# Patient Record
Sex: Female | Born: 1995 | Race: White | Hispanic: No | Marital: Married | Smoking: Never smoker
Health system: Southern US, Community
[De-identification: ages and names within clinical notes are randomized; demographics above are authoritative.]

## PROBLEM LIST (undated history)

## (undated) DIAGNOSIS — N809 Endometriosis, unspecified: Secondary | ICD-10-CM

## (undated) DIAGNOSIS — E039 Hypothyroidism, unspecified: Secondary | ICD-10-CM

## (undated) DIAGNOSIS — J45909 Unspecified asthma, uncomplicated: Secondary | ICD-10-CM

## (undated) HISTORY — DX: Endometriosis, unspecified: N80.9

## (undated) HISTORY — PX: WISDOM TOOTH EXTRACTION: SHX21

---

## 2017-05-12 ENCOUNTER — Other Ambulatory Visit (HOSPITAL_COMMUNITY): Payer: Self-pay | Admitting: Obstetrics and Gynecology

## 2017-05-12 DIAGNOSIS — Z3689 Encounter for other specified antenatal screening: Secondary | ICD-10-CM

## 2017-05-20 ENCOUNTER — Encounter (HOSPITAL_COMMUNITY): Payer: Self-pay

## 2017-05-21 ENCOUNTER — Ambulatory Visit (HOSPITAL_COMMUNITY)
Admission: RE | Admit: 2017-05-21 | Discharge: 2017-05-21 | Disposition: A | Discharge status: Home / Self Care | Payer: Medicaid Other | Source: Ambulatory Visit | Attending: Obstetrics and Gynecology | Admitting: Obstetrics and Gynecology

## 2017-05-21 ENCOUNTER — Other Ambulatory Visit (HOSPITAL_COMMUNITY): Payer: Self-pay | Admitting: Obstetrics and Gynecology

## 2017-05-21 ENCOUNTER — Encounter (HOSPITAL_COMMUNITY): Payer: Self-pay

## 2017-05-21 DIAGNOSIS — Z363 Encounter for antenatal screening for malformations: Secondary | ICD-10-CM

## 2017-05-21 DIAGNOSIS — O99282 Endocrine, nutritional and metabolic diseases complicating pregnancy, second trimester: Secondary | ICD-10-CM | POA: Diagnosis not present

## 2017-05-21 DIAGNOSIS — O281 Abnormal biochemical finding on antenatal screening of mother: Secondary | ICD-10-CM

## 2017-05-21 DIAGNOSIS — Z3A20 20 weeks gestation of pregnancy: Secondary | ICD-10-CM

## 2017-05-21 DIAGNOSIS — O280 Abnormal hematological finding on antenatal screening of mother: Secondary | ICD-10-CM

## 2017-05-21 DIAGNOSIS — O321XX0 Maternal care for breech presentation, not applicable or unspecified: Secondary | ICD-10-CM | POA: Diagnosis not present

## 2017-05-21 DIAGNOSIS — Z3689 Encounter for other specified antenatal screening: Secondary | ICD-10-CM

## 2017-05-21 DIAGNOSIS — E039 Hypothyroidism, unspecified: Secondary | ICD-10-CM | POA: Insufficient documentation

## 2017-05-21 HISTORY — DX: Hypothyroidism, unspecified: E03.9

## 2017-05-21 NOTE — Addendum Note (Signed)
Encounter addended by: Genevie CheshireWaken, Godson Pollan M, RT on: 05/21/2017 3:18 PM  Actions taken: Imaging Exam ended

## 2017-05-21 NOTE — Progress Notes (Signed)
Genetic Counseling  High-Risk Gestation Note  Appointment Date:  05/21/2017 Referred By: Margaret Levine, Craig, MD Date of Birth:  [birth year] Partner:  Margaret Levine   Pregnancy History: G1P0 Estimated Date of Delivery: 10/06/17 Estimated Gestational Age: 8963w2d Attending: Ledon SnareBrian Brost, MD   Ms. Margaret Levine, were seen for genetic counseling because of an increased risk for fetal Down syndrome based on Quad screen through [facility].  In summary:  Reviewed results of Quad screening test  Increased risk for Down syndrome (1 in 187)  Trisomy 18 and Open Spina Bifida risks within normal limits  Elevated hCG and DIA- associated with increased risk for adverse pregnancy outcomes, consider third trimester ultrasound to assess fetal growth  Discussed additional screening options  NIPS- elected to pursue Panorama today   Ultrasound- performed today; see separate report  Discussed diagnostic testing options  Amniocentesis- declined  Reviewed family history concerns  Discussed general population carrier screening options- patient reported previously performed  CF  SMA  Hemoglobinopathies  They were counseled regarding the Quad screening result and the associated 1 in 187 risk for fetal Down syndrome.  We reviewed chromosomes, nondisjunction, and the common features and variable prognosis of Down syndrome.  In addition, we reviewed the screen adjusted reduction in risks for trisomy 18 and open neural tube defects.  We also discussed other explanations for a screen positive result including: a gestational dating error, differences in maternal metabolism, and normal variation.  We reviewed other available screening options including noninvasive prenatal screening (NIPS)/cell free DNA (cfDNA) screening and detailed ultrasound. They were counseled that screening tests are used to modify a patient's a priori risk for aneuploidy, typically based on age.  This estimate provides a pregnancy specific risk assessment. We reviewed the benefits and limitations of each option. Specifically, we discussed the conditions for which each test screens, the detection rates, and false positive rates of each. They were also counseled regarding diagnostic testing via amniocentesis. We reviewed the approximate 1 in 300-500 risk for complications from amniocentesis, including spontaneous pregnancy loss. We discussed the possible results that the tests might provide including: positive, negative, unanticipated, and no result. Finally, they were counseled regarding the cost of each option and potential out of pocket expenses. After consideration of all the options, they elected to proceed with NIPS ([facility]).  Those results will be available in 8-10 days.    A complete ultrasound was performed today. The ultrasound report will be sent under separate cover. There were no visualized fetal anomalies or markers suggestive of aneuploidy. Follow-up ultrasound was scheduled in 4 weeks to complete fetal anatomic survey. Diagnostic testing was declined today.  They understand that screening tests cannot rule out all birth defects or genetic syndromes. The patient was advised of this limitation and states she still does not want additional testing at this time.   Margaret Levine was provided with written information regarding cystic fibrosis (CF), spinal muscular atrophy (SMA) and hemoglobinopathies including the carrier frequency, availability of carrier screening and prenatal diagnosis if indicated.  In addition, we discussed that CF and hemoglobinopathies are routinely screened for as part of the [place] newborn screening panel.  She reported that screening was performed through her OB office and were within normal limits. We do not have medical documentation confirming this report at this time.    Both family histories were reviewed and found to be  noncontributory for birth defect, intellectual disability, recurrent pregnancy loss, and known genetic conditions.  Consanguinity was denied for the couple. Without further information regarding the provided family history, an accurate genetic risk cannot be calculated. Further genetic counseling is warranted if more information is obtained.  Ms. Margaret Levine denied exposure to environmental toxins or chemical agents. She denied the use of alcohol, tobacco or street drugs. She denied significant viral illnesses during the course of her pregnancy. Her medical and surgical histories were contributory for thyroid disease, for which she is treated with levothyroxine.   I counseled this couple for approximately 40 minutes regarding the above risks and available options.   Margaret PlowmanKaren Darlene Bartelt, MS,  Certified Genetic Counselor 05/21/2017

## 2017-05-24 ENCOUNTER — Other Ambulatory Visit (HOSPITAL_COMMUNITY): Payer: Self-pay | Admitting: *Deleted

## 2017-05-24 DIAGNOSIS — Z0489 Encounter for examination and observation for other specified reasons: Secondary | ICD-10-CM

## 2017-05-24 DIAGNOSIS — IMO0002 Reserved for concepts with insufficient information to code with codable children: Secondary | ICD-10-CM

## 2017-06-01 ENCOUNTER — Telehealth (HOSPITAL_COMMUNITY): Payer: Self-pay | Admitting: MS"

## 2017-06-01 NOTE — Telephone Encounter (Signed)
Called Margaret Levine Levine to discuss her prenatal cell free DNA test results.  Ms. Margaret Levine Conti had [facility]ama testing through [facility].  Testing was offered because of abnormal maternal serum screening for Down syndrome.   The patient was identified by name and DOB.  We reviewed that these are within normal limits, showing a less than 1 in 10,000 risk for trisomies 21, 18 and 13, and monosomy X (Turner syndrome).  In addition, the risk for triploidy and sex chromosome trisomies (47,XXX and 47,XXY) was also low risk.  We reviewed that this testing identifies > 99% of pregnancies with trisomy 1821, trisomy 9013, sex chromosome trisomies (47,XXX and 47,XXY), and triploidy. The detection rate for trisomy 18 is 96%.  The detection rate for monosomy X is ~92%.  The false positive rate is <0.1% for all conditions. Testing was also consistent with female fetal sex.  The patient did wish to know fetal sex.  She understands that this testing does not identify all genetic conditions.  All questions were answered to her satisfaction, she was encouraged to call with additional questions or concerns.  Quinn PlowmanKaren Terryon Pineiro, MS Certified Genetic Counselor 06/01/2017 1:33 PM

## 2017-06-18 ENCOUNTER — Encounter (HOSPITAL_COMMUNITY): Payer: Self-pay

## 2017-06-18 ENCOUNTER — Other Ambulatory Visit (HOSPITAL_COMMUNITY): Payer: Self-pay | Admitting: *Deleted

## 2017-06-18 ENCOUNTER — Ambulatory Visit (HOSPITAL_COMMUNITY)
Admission: RE | Admit: 2017-06-18 | Discharge: 2017-06-18 | Disposition: A | Discharge status: Home / Self Care | Payer: Medicaid Other | Source: Ambulatory Visit | Attending: Obstetrics and Gynecology | Admitting: Obstetrics and Gynecology

## 2017-06-18 DIAGNOSIS — O283 Abnormal ultrasonic finding on antenatal screening of mother: Secondary | ICD-10-CM | POA: Insufficient documentation

## 2017-06-18 DIAGNOSIS — Z363 Encounter for antenatal screening for malformations: Secondary | ICD-10-CM | POA: Insufficient documentation

## 2017-06-18 DIAGNOSIS — E079 Disorder of thyroid, unspecified: Secondary | ICD-10-CM

## 2017-06-18 DIAGNOSIS — O99280 Endocrine, nutritional and metabolic diseases complicating pregnancy, unspecified trimester: Principal | ICD-10-CM

## 2017-06-18 DIAGNOSIS — Z3A24 24 weeks gestation of pregnancy: Secondary | ICD-10-CM | POA: Diagnosis not present

## 2017-06-18 DIAGNOSIS — O99282 Endocrine, nutritional and metabolic diseases complicating pregnancy, second trimester: Secondary | ICD-10-CM | POA: Diagnosis not present

## 2017-06-18 DIAGNOSIS — E039 Hypothyroidism, unspecified: Secondary | ICD-10-CM | POA: Insufficient documentation

## 2017-06-18 DIAGNOSIS — O280 Abnormal hematological finding on antenatal screening of mother: Secondary | ICD-10-CM

## 2017-06-18 DIAGNOSIS — IMO0002 Reserved for concepts with insufficient information to code with codable children: Secondary | ICD-10-CM

## 2017-06-18 DIAGNOSIS — Z0489 Encounter for examination and observation for other specified reasons: Secondary | ICD-10-CM

## 2017-07-13 HISTORY — PX: LAPAROSCOPY: SHX197

## 2017-07-13 HISTORY — PX: APPENDECTOMY: SHX54

## 2017-07-30 ENCOUNTER — Other Ambulatory Visit (HOSPITAL_COMMUNITY): Payer: Self-pay | Admitting: Obstetrics and Gynecology

## 2017-07-30 ENCOUNTER — Encounter (HOSPITAL_COMMUNITY): Payer: Self-pay

## 2017-07-30 ENCOUNTER — Ambulatory Visit (HOSPITAL_COMMUNITY)
Admission: RE | Admit: 2017-07-30 | Discharge: 2017-07-30 | Disposition: A | Discharge status: Home / Self Care | Payer: Medicaid Other | Source: Ambulatory Visit | Attending: Obstetrics and Gynecology | Admitting: Obstetrics and Gynecology

## 2017-07-30 DIAGNOSIS — O280 Abnormal hematological finding on antenatal screening of mother: Secondary | ICD-10-CM

## 2017-07-30 DIAGNOSIS — O99281 Endocrine, nutritional and metabolic diseases complicating pregnancy, first trimester: Secondary | ICD-10-CM | POA: Insufficient documentation

## 2017-07-30 DIAGNOSIS — O99280 Endocrine, nutritional and metabolic diseases complicating pregnancy, unspecified trimester: Principal | ICD-10-CM

## 2017-07-30 DIAGNOSIS — E039 Hypothyroidism, unspecified: Secondary | ICD-10-CM | POA: Diagnosis not present

## 2017-07-30 DIAGNOSIS — E079 Disorder of thyroid, unspecified: Secondary | ICD-10-CM

## 2017-07-30 DIAGNOSIS — Z3A30 30 weeks gestation of pregnancy: Secondary | ICD-10-CM

## 2017-07-30 DIAGNOSIS — Z362 Encounter for other antenatal screening follow-up: Secondary | ICD-10-CM

## 2017-07-30 DIAGNOSIS — O289 Unspecified abnormal findings on antenatal screening of mother: Secondary | ICD-10-CM

## 2017-07-30 NOTE — ED Notes (Signed)
[facility]  Reports some vaginal spotting after intercourse and BM this morning.  Pt states it was bright red but stopped without needing to wear a sanitary pad.  Reports some uterine irritability approx. X 3 today when bleeding occurred.  Good fetal movement.

## 2017-07-30 NOTE — Addendum Note (Signed)
Encounter addended by: Melynda KellerVics, Jilliane Kazanjian R, RDMS on: 07/30/2017 4:04 PM  Actions taken: Imaging Exam ended

## 2017-08-03 ENCOUNTER — Other Ambulatory Visit (HOSPITAL_COMMUNITY): Payer: Self-pay | Admitting: *Deleted

## 2017-08-03 DIAGNOSIS — O280 Abnormal hematological finding on antenatal screening of mother: Secondary | ICD-10-CM

## 2017-08-27 ENCOUNTER — Ambulatory Visit (HOSPITAL_COMMUNITY)
Admission: RE | Admit: 2017-08-27 | Discharge: 2017-08-27 | Disposition: A | Discharge status: Home / Self Care | Payer: Medicaid Other | Source: Ambulatory Visit | Attending: Obstetrics and Gynecology | Admitting: Obstetrics and Gynecology

## 2017-08-27 ENCOUNTER — Encounter (HOSPITAL_COMMUNITY): Payer: Self-pay

## 2017-10-06 ENCOUNTER — Emergency Department (HOSPITAL_COMMUNITY)
Admission: EM | Admit: 2017-10-06 | Discharge: 2017-10-07 | Disposition: A | Discharge status: Home / Self Care | Payer: Commercial Managed Care - PPO | Attending: Emergency Medicine | Admitting: Emergency Medicine

## 2017-10-06 ENCOUNTER — Encounter (HOSPITAL_COMMUNITY): Payer: Self-pay | Admitting: Emergency Medicine

## 2017-10-06 ENCOUNTER — Other Ambulatory Visit: Payer: Self-pay

## 2017-10-06 ENCOUNTER — Emergency Department (HOSPITAL_COMMUNITY): Payer: Commercial Managed Care - PPO

## 2017-10-06 DIAGNOSIS — K668 Other specified disorders of peritoneum: Secondary | ICD-10-CM | POA: Diagnosis not present

## 2017-10-06 DIAGNOSIS — Z79899 Other long term (current) drug therapy: Secondary | ICD-10-CM | POA: Diagnosis not present

## 2017-10-06 DIAGNOSIS — R079 Chest pain, unspecified: Secondary | ICD-10-CM | POA: Diagnosis not present

## 2017-10-06 DIAGNOSIS — E039 Hypothyroidism, unspecified: Secondary | ICD-10-CM | POA: Insufficient documentation

## 2017-10-06 HISTORY — DX: Unspecified asthma, uncomplicated: J45.909

## 2017-10-06 LAB — BASIC METABOLIC PANEL
ANION GAP: 10 (ref 5–15)
BUN: 7 mg/dL (ref 6–20)
CALCIUM: 8 mg/dL — AB (ref 8.9–10.3)
CO2: 20 mmol/L — ABNORMAL LOW (ref 22–32)
Chloride: 106 mmol/L (ref 101–111)
Creatinine, Ser: 0.57 mg/dL (ref 0.44–1.00)
Glucose, Bld: 90 mg/dL (ref 65–99)
Potassium: 3.7 mmol/L (ref 3.5–5.1)
SODIUM: 136 mmol/L (ref 135–145)

## 2017-10-06 LAB — CBC
HCT: 33.9 % — ABNORMAL LOW (ref 36.0–46.0)
HEMOGLOBIN: 11 g/dL — AB (ref 12.0–15.0)
MCH: 31.2 pg (ref 26.0–34.0)
MCHC: 32.4 g/dL (ref 30.0–36.0)
MCV: 96 fL (ref 78.0–100.0)
PLATELETS: DECREASED 10*3/uL (ref 150–400)
RBC: 3.53 MIL/uL — AB (ref 3.87–5.11)
RDW: 14.3 % (ref 11.5–15.5)
WBC: 13.5 10*3/uL — AB (ref 4.0–10.5)

## 2017-10-06 LAB — I-STAT TROPONIN, ED: TROPONIN I, POC: 0 ng/mL (ref 0.00–0.08)

## 2017-10-06 LAB — I-STAT BETA HCG BLOOD, ED (MC, WL, AP ONLY)

## 2017-10-06 MED ORDER — IOPAMIDOL (ISOVUE-300) INJECTION 61%
INTRAVENOUS | Status: AC
  Filled 2017-10-06: qty 30

## 2017-10-06 NOTE — ED Triage Notes (Signed)
Pt arrives from home via [facility] reporting allergic rxn to unknown substance with substernal CP radiating to L chest, describes as "squeezing."  EMS reports giving 50 mL IV benadryl and 324 ASA.  On arrival pt reports "sandpaper feeling" in throat, denies itching or tingling to lips or tongue.  Rash noted to upper chest wal, some redness to bilat cheeks. Resp e/u, pt denies CP at this time.

## 2017-10-06 NOTE — ED Provider Notes (Signed)
[facility] Provider Note   CSN: [id number] Arrival date & time: 10/06/17  2020     History   Chief Complaint Chief Complaint  Patient presents with  . Chest Pain  . Allergic Reaction    HPI Margaret LeakKendra Levine is a 22 y.o. female.  The history is provided by the patient and medical records.   22 y.o. F with hx of hypothyroidism, presenting to the ED for chest pain and SOB.  Patient states she was discharged from the hospital earlier today [place]  No immediate complications.  States she went home today but did not have a BM yet.  States she felt the urge today, went to have a BM and afterwards started to feel bad.  States a few hours later she started to develop some chest pain and trouble breathing.  States pain sharp in lower chest.  She denies any cough or fever.  States pain persisted.  When she arrived here she was told that she had hives on her chest but she did not notice that at home.  She denies any generalized itching, sensation of throat closing, trouble swallowing, etc.  States only change today was she finally took a shower.  She did notice some splotchy spots on her abdomen.  No new soaps or detergents at home.  They did send her home with oxycodone, Tylenol, and Motrin from the hospital but she has taken all of these in the past without issue.  Her only known allergy is cockroaches.  She is currently breastfeeding.  Patient denies any pain by time of my evaluation (approx 2 hours after arrival in ED).  Past Medical History:  Diagnosis Date  . Hypothyroidism     Patient Active Problem List   Diagnosis Date Noted  . Abnormal maternal serum alpha-fetoprotein 05/21/2017  . [redacted] weeks gestation of pregnancy     Past Surgical History:  Procedure Laterality Date  . CESAREAN SECTION    . WISDOM TOOTH EXTRACTION       OB History    Gravida  1   Para  1   Term  1   Preterm      AB      Living  1      SAB      TAB      Ectopic      Multiple      Live Births  1            Home Medications    Prior to Admission medications   Medication Sig Start Date End Date Taking? Authorizing Provider  flintstones complete (FLINTSTONES) 60 MG chewable tablet Chew 1 tablet by mouth daily.    [provider]  Ondansetron HCl (ZOFRAN PO) Take by mouth.    [provider]  Thyroid (LEVOTHYROXINE-LIOTHYRONINE PO) Take 25 mcg daily by mouth.    [provider]    Family History No family history on file.  Social History Social History   Tobacco Use  . Smoking status: Never Smoker  . Smokeless tobacco: Never Used  Substance Use Topics  . Alcohol use: No    Frequency: Never  . Drug use: No     Allergies   Patient has no known allergies.   Review of Systems Review of Systems  Respiratory: Positive for shortness of breath.   Cardiovascular: Positive for chest pain.  All other systems reviewed and are negative.    Physical Exam Updated  Vital Signs BP 128/89 (BP Location: Right Arm)   Pulse 84   Temp 99.1 F (37.3 C) (Oral)   Resp 16   LMP 12/30/2016   SpO2 100%   Breastfeeding? Yes   Physical Exam  Constitutional: She is oriented to person, place, and time. She appears well-developed and well-nourished.  HENT:  Head: Normocephalic and atraumatic.  Mouth/Throat: Oropharynx is clear and moist.  No oral swelling, airway patent, handling secretions well, normal phonation without stridor  Eyes: Pupils are equal, round, and reactive to light. Conjunctivae and EOM are normal.  Neck: Normal range of motion.  Cardiovascular: Normal rate, regular rhythm and normal heart sounds.  Pulmonary/Chest: Effort normal and breath sounds normal. She has no wheezes. She has no rhonchi.  Abdominal: Soft. Bowel sounds are normal. There is no tenderness. There is no rigidity and no guarding.  c-section scar across lower abdomen appears clean, no signs of  infection; there is some splotchy areas on the abdomen but no defined rash, areas are non-pruritic  Musculoskeletal: Normal range of motion.  Neurological: She is alert and oriented to person, place, and time.  Skin: Skin is warm and dry. No rash noted.  No discrete rash  Psychiatric: She has a normal mood and affect.  Nursing note and vitals reviewed.    ED Treatments / Results  Labs (all labs ordered are listed, but only abnormal results are displayed) Labs Reviewed  BASIC METABOLIC PANEL - Abnormal; Notable for the following components:      Result Value   CO2 20 (*)    Calcium 8.0 (*)    All other components within normal limits  CBC - Abnormal; Notable for the following components:   WBC 13.5 (*)    RBC 3.53 (*)    Hemoglobin 11.0 (*)    HCT 33.9 (*)    All other components within normal limits  I-STAT BETA HCG BLOOD, ED ([facility], [facility], [facility] ONLY) - Abnormal; Notable for the following components:   I-stat hCG, quantitative >2,000.0 (*)    All other components within normal limits  I-STAT TROPONIN, ED    EKG ED ECG REPORT   Date: 10/07/2017  Rate: 80  Rhythm: normal sinus rhythm  QRS Axis: normal  Intervals: normal  ST/T Wave abnormalities: normal  Conduction Disutrbances:none  Narrative Interpretation:   Old EKG Reviewed: none available  I have personally reviewed the EKG tracing and agree with the computerized printout as noted.   Radiology Dg Chest 2 View  Addendum Date: 10/06/2017   ADDENDUM REPORT: 10/06/2017 21:38 ADDENDUM: Study discussed by telephone with Dr. Shaune Pollack on 10/06/2017 at 2119 hours. He advises that this patient might be recently status C-section (not a certainty when we spoke). We discussed that a recent surgery might satisfactorily explain the small volume of pneumoperitoneum. He will evaluate the patient further. Electronically Signed   By: Odessa Fleming M.D.   On: 10/06/2017 21:38   Result Date: 10/06/2017 CLINICAL DATA:  22 year old female with  substernal chest pain. EXAM: CHEST - 2 VIEW COMPARISON:  Chest radiographs 02/16/2013. FINDINGS: Positive for pneumoperitoneum. There is a small volume of free fluid subjacent to the diaphragm on both views. The visible bowel gas pattern is unremarkable. No pneumothorax or pneumomediastinum is evident. Visualized tracheal air column is within normal limits. No pulmonary edema, pleural effusion or abnormal pulmonary opacity. No osseous abnormality identified. IMPRESSION: 1. Positive for free air under the diaphragm suggesting an acute bowel perforation. Recommend CT Abdomen and Pelvis (oral and  IV contrast preferred if the patient is stable). 2.  No acute cardiopulmonary abnormality. Electronically Signed: By: Odessa Fleming M.D. On: 10/06/2017 21:15   Ct Abdomen Pelvis W Contrast  Result Date: 10/07/2017 CLINICAL DATA:  22 year old female with history of recent C-section. Concern for peritonitis EXAM: CT ABDOMEN AND PELVIS WITH CONTRAST TECHNIQUE: Multidetector CT imaging of the abdomen and pelvis was performed using the standard protocol following bolus administration of intravenous contrast. CONTRAST:  <See Chart> ISOVUE-300 IOPAMIDOL (ISOVUE-300) INJECTION 61% COMPARISON:  Chest radiograph dated 10/06/2017 and CT of the abdomen pelvis dated 09/07/2011 FINDINGS: Lower chest: Trace bilateral pleural effusions with associated minimal compressive atelectasis of the lung bases. Small scattered pneumoperitoneum, consistent with history of recent C-section. There is small amount of free fluid within the pelvis and right upper quadrant. Hepatobiliary: Focal area of decreased enhancement along the inferior age of the right lobe of liver (series 3, image 48) may reflect focal edema or related to a traumatic injury. There is moderate periportal edema. There is diffuse thickened and edematous appearance of the gallbladder wall which may be related to underlying systemic or metabolic etiology. Acute cholecystitis is not entirely  excluded. Clinical correlation is recommended. Tiny sludge or stones may be present within the gallbladder. Ultrasound may provide better evaluation of the gallbladder. Pancreas: Unremarkable. No pancreatic ductal dilatation or surrounding inflammatory changes. Spleen: Normal in size without focal abnormality. Adrenals/Urinary Tract: The adrenal glands are unremarkable. There is mild right hydroureter, likely related to mass effect and compression of the distal right ureter by the enlarged uterus. The left kidney is unremarkable. The urinary bladder distended and grossly unremarkable. Stomach/Bowel: Oral contrast opacifies loops of small bowel and traverses into the colon. There is no bowel obstruction or active inflammation. The appendix is normal. Vascular/Lymphatic: The abdominal aorta and IVC appear unremarkable. No portal venous gas. There is no adenopathy. Reproductive: The uterus is enlarged and heterogeneous consistent with history of recent delivery. There is postsurgical changes of C-section with multiple tiny specks of air within the endometrial canal, likely postsurgical. The ovaries are grossly unremarkable. Other: Anterior pelvic wall C-section with stranding of the subcutaneous fat and pockets of soft pannus air. No drainable fluid collection or abscess. Musculoskeletal: No acute or significant osseous findings. IMPRESSION: 1. Postsurgical changes of recent C-section with small amount of pneumoperitoneum. No drainable fluid collection or abscess. 2. Focal area of decreased enhancement in the inferior edge of the right lobe of the liver may represent focal edema or related to focal traumatic injury. Clinical correlation is recommended. 3. Moderate periportal edema and diffuse thickening of the gallbladder wall. Findings may be related to an underlying metabolic or systemic etiology or hepatic injury. Acute cholecystitis is not entirely excluded. Clinical correlation is recommended. Ultrasound may  provide better evaluation of the gallbladder if clinically indicated. 4. Mild right hydronephrosis secondary to compression of the distal right ureter by the enlarged uterus. Electronically Signed   By: Elgie Collard M.D.   On: 10/07/2017 01:45    Procedures Procedures (including critical care time)  Medications Ordered in ED Medications  iopamidol (ISOVUE-300) 61 % injection (100 mLs  Contrast Given 10/07/17 0110)     Initial Impression / Assessment and Plan / ED Course  I have reviewed the triage vital signs and the nursing notes.  Pertinent labs & imaging results that were available during my care of the patient were reviewed by me and considered in my medical decision making (see chart for details).  22 year old female presenting  to the ED with chest pain and shortness of breath PTA.  She had recent C-section 3 days ago at [facility].  She is awake, alert, appropriately oriented.  Vital signs are stable.  He had screening labs and chest x-ray performed out front which were concerning for pneumoperitoneum.  There was also noted rash out front, however patient denies any generalized itching, trouble swallowing, sensation of throat closing, etc.  On my assessment patient's surgical incision appears clean without any signs of infection.  Abdomen is not significantly tender, does endorse some "soreness".  She denies any active chest pain or shortness of breath at present.  She has no tachycardia or hypoxia to suggest acute PE.  She has no discrete rash, does have some splotchiness on her abdomen that blanches.  This is not pruritic.  Given findings on CXR, will obtain CT scan to ensure no underlying abnormalities in the abdomen.  Patient is comfortable and resting at this time.  Patient's CT scan with some pneumoperitoneum, can be normal given recent C-section.  She does have some edema along the inferior margin of the right lobe of the liver-- ? Reactive vs traumatic injury.  Also has  noted thickening of gallbladder wall.  Will discuss with general surgery.  2:03 AM Discussed results with Dr. Magnus Ivan-- appreciate his input.  He has reviewed CT and we have gone over this together.  Does not suspect any acute traumatic injury to the liver, amount of pneumoperitoneum is expected so close post-op.  In regards to gallbladder wall thickening, does not feel this is concerning for acute cholecystitis.  Can get [place] if needed.  Patient clinically without focal RUQ pain on repeat exam, no nausea/vomiting here.  She only reports soreness along incision which is to be expected.  Low clinical suspicion for acute cholecystitis at this time.  Continues to deny any active chest pain or SOB here.  No signs/symptoms currently suggestive of PE, cardiomyopathy, endocarditis, or ACS.  Have reviewed all results and recommendations with patient and family.  She will be discharged home to continue symptomatic care.  Will have her follow-up closely with her OB.  She understands to return here for any new/acute changes.  Final Clinical Impressions(s) / ED Diagnoses   Final diagnoses:  Chest pain, unspecified type    ED Discharge Orders    None       Garlon Hatchet, PA-C 10/07/17 [id number]    Mancel Bale, MD 10/09/17 1728

## 2017-10-07 ENCOUNTER — Emergency Department (HOSPITAL_COMMUNITY): Payer: Commercial Managed Care - PPO

## 2017-10-07 ENCOUNTER — Encounter (HOSPITAL_COMMUNITY): Payer: Self-pay | Admitting: Radiology

## 2017-10-07 DIAGNOSIS — R079 Chest pain, unspecified: Secondary | ICD-10-CM | POA: Diagnosis not present

## 2017-10-07 MED ORDER — IOPAMIDOL (ISOVUE-300) INJECTION 61%
INTRAVENOUS | Status: AC
  Administered 2017-10-07: 100 mL
  Filled 2017-10-07: qty 100

## 2017-10-07 NOTE — Discharge Instructions (Signed)
Continue your home medications from your doctor. If you develop rash you can try benadryl.  If this makes you too drowsy, you can try Claritin or zyrtec. Follow-up with your OB-GYN as scheduled. Return here for any new/acute changes-- worsening pain, high fever, vomiting, trouble taking medicines, etc.

## 2017-10-07 NOTE — ED Notes (Signed)
Pt SO exited from room stating pt is "breaking out again" Pt noted to have red splotches on her face, neck, and chest. Pt denies itching, resting comfortably in bed. PA is aware and states she will come assess shortly.

## 2018-07-26 IMAGING — DX DG CHEST 2V
2 series · 2 of 2 positions shown · non-contrast
Comparison: Chest radiographs 02/16/2013.

ADDENDUM:
Study discussed by telephone with Dr. QOMANDAN TIGER on 10/06/2017 at
3335 hours.
He advises that this patient might be recently status C-section (not
a certainty when we spoke). We discussed that a recent surgery might
satisfactorily explain the small volume of pneumoperitoneum. He will
evaluate the patient further.
CLINICAL DATA: 21-year-old female with substernal chest pain.

EXAM:
CHEST - 2 VIEW

[chest pa]
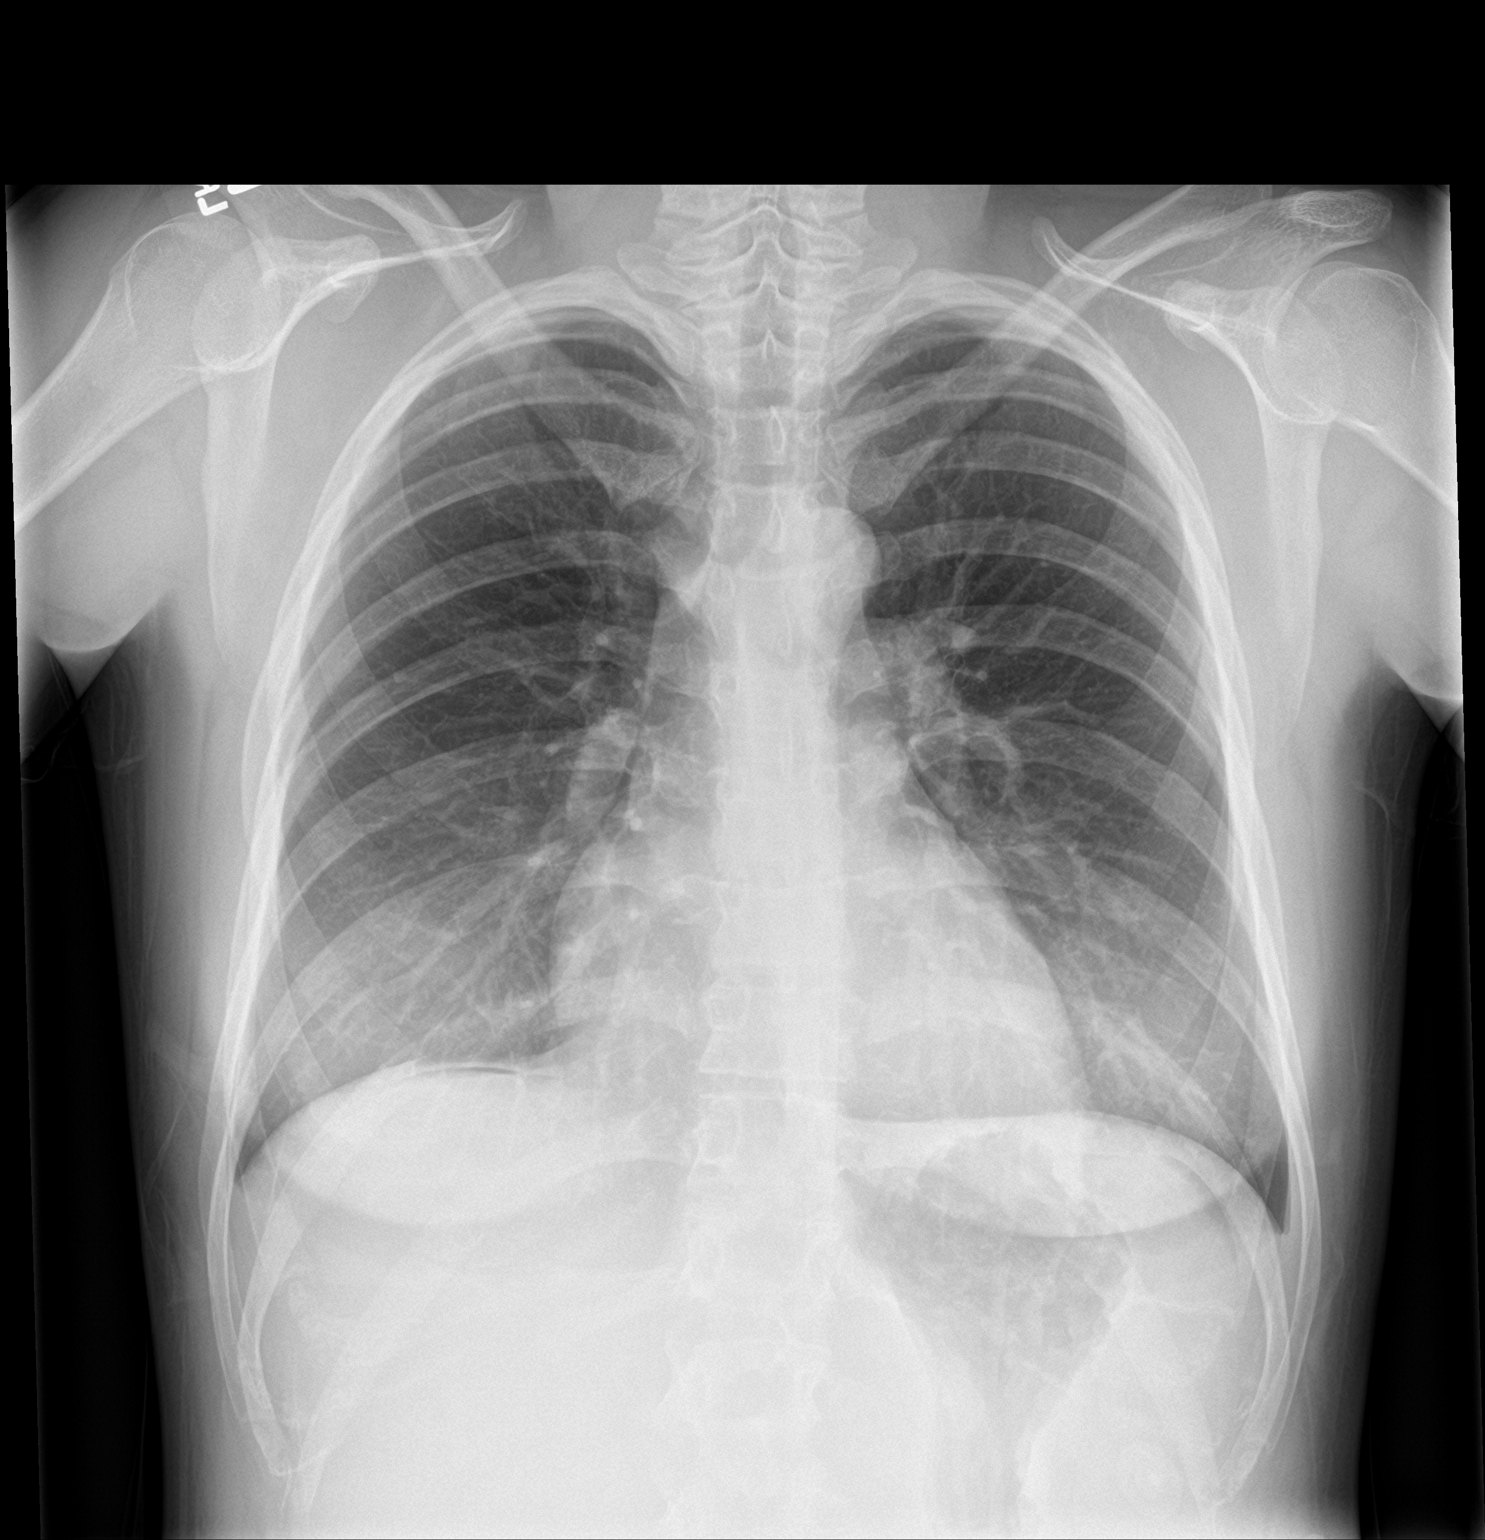

[chest lat]
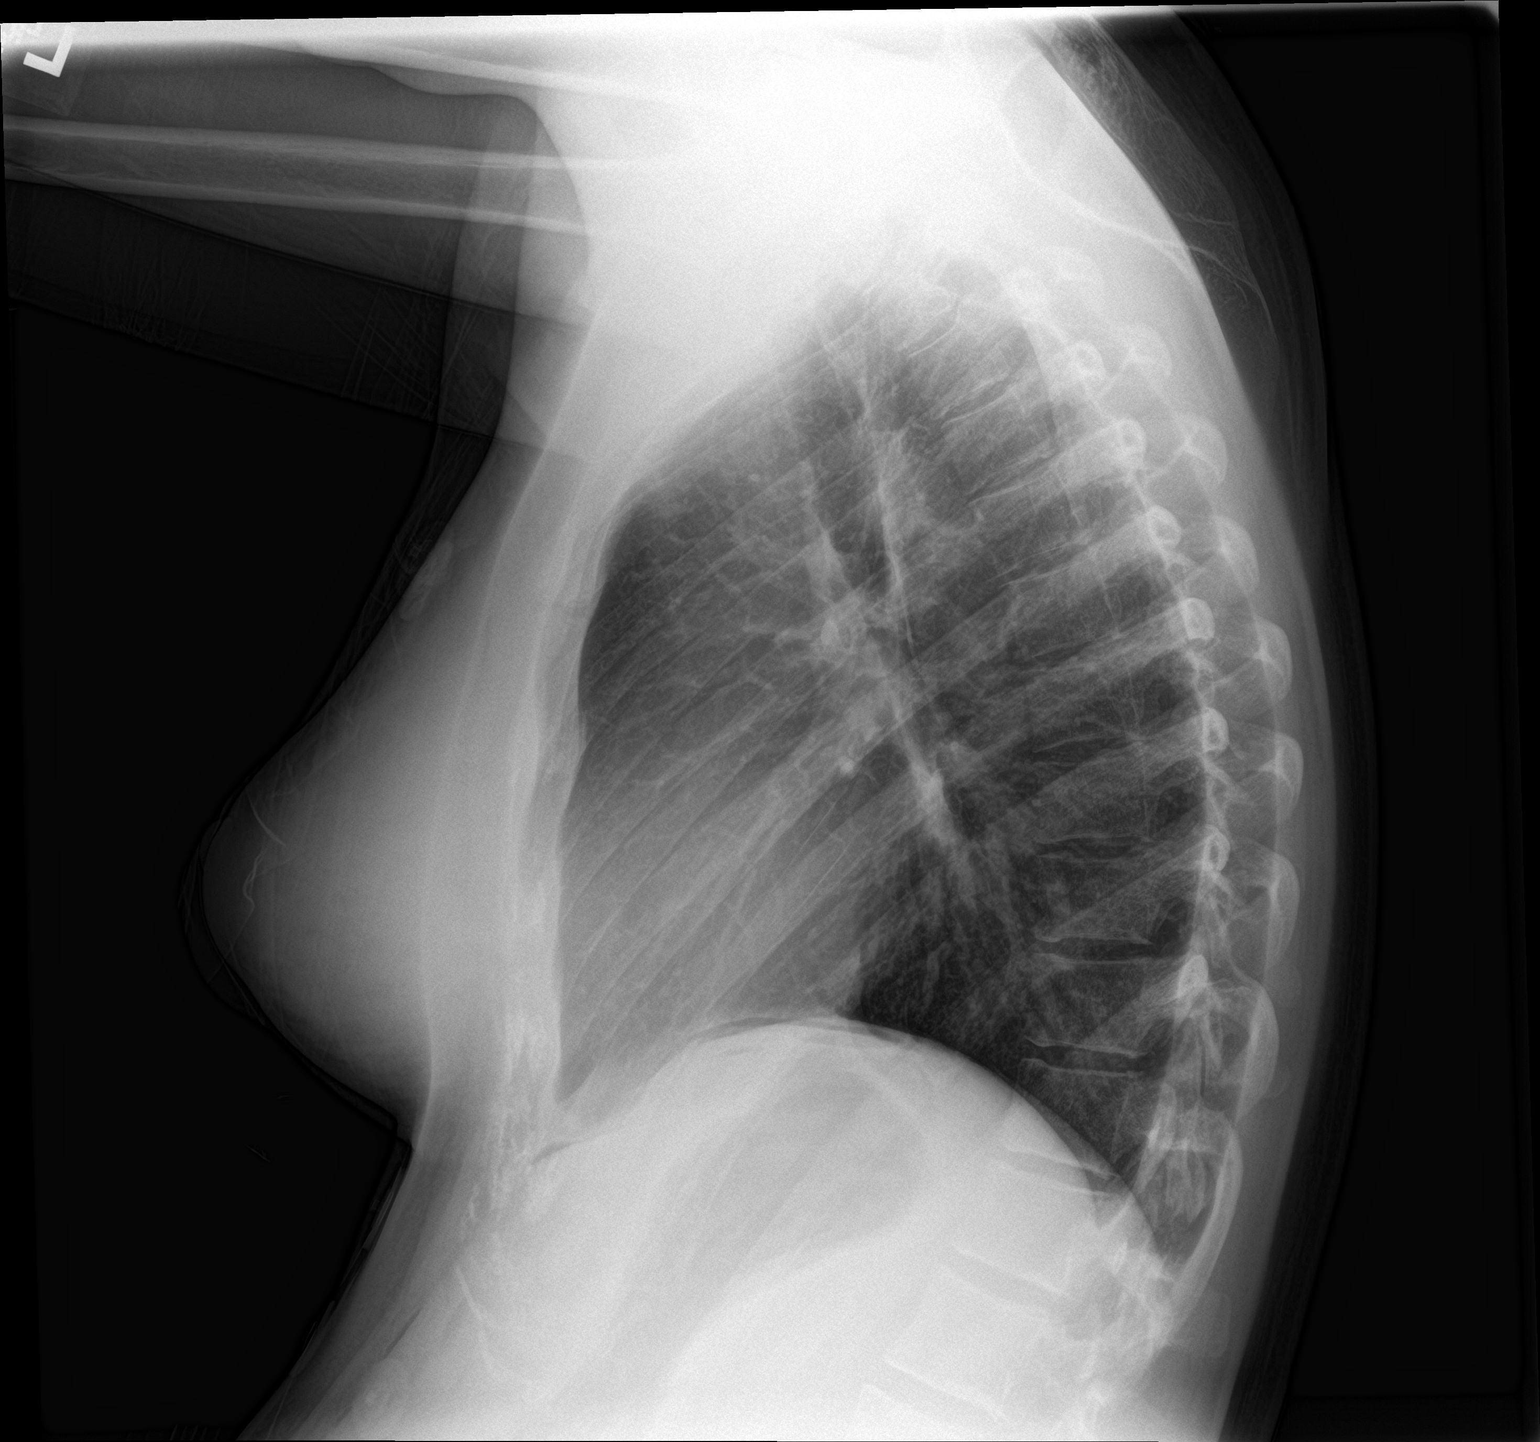

[2 of 2 positions shown; findings below may reference images not displayed]

FINDINGS: Positive for pneumoperitoneum. There is a small volume of free fluid
subjacent to the diaphragm on both views. The visible bowel gas
pattern is unremarkable.

No pneumothorax or pneumomediastinum is evident. Visualized tracheal
air column is within normal limits. No pulmonary edema, pleural
effusion or abnormal pulmonary opacity.

No osseous abnormality identified.
IMPRESSION: 1. Positive for free air under the diaphragm suggesting an acute
bowel perforation. Recommend CT Abdomen and Pelvis (oral and IV
contrast preferred if the patient is stable).
2.  No acute cardiopulmonary abnormality.

## 2019-01-03 DIAGNOSIS — K219 Gastro-esophageal reflux disease without esophagitis: Secondary | ICD-10-CM | POA: Diagnosis not present

## 2019-01-03 DIAGNOSIS — R1031 Right lower quadrant pain: Secondary | ICD-10-CM | POA: Diagnosis not present

## 2019-01-03 DIAGNOSIS — K529 Noninfective gastroenteritis and colitis, unspecified: Secondary | ICD-10-CM | POA: Diagnosis not present

## 2019-01-03 DIAGNOSIS — J45909 Unspecified asthma, uncomplicated: Secondary | ICD-10-CM | POA: Diagnosis not present

## 2019-01-04 DIAGNOSIS — K219 Gastro-esophageal reflux disease without esophagitis: Secondary | ICD-10-CM | POA: Diagnosis not present

## 2019-01-04 DIAGNOSIS — R1031 Right lower quadrant pain: Secondary | ICD-10-CM | POA: Diagnosis not present

## 2019-01-04 DIAGNOSIS — J45909 Unspecified asthma, uncomplicated: Secondary | ICD-10-CM | POA: Diagnosis not present

## 2019-01-04 DIAGNOSIS — K529 Noninfective gastroenteritis and colitis, unspecified: Secondary | ICD-10-CM | POA: Diagnosis not present

## 2021-08-29 DIAGNOSIS — L219 Seborrheic dermatitis, unspecified: Secondary | ICD-10-CM | POA: Diagnosis not present

## 2021-09-11 DIAGNOSIS — R1084 Generalized abdominal pain: Secondary | ICD-10-CM | POA: Diagnosis not present

## 2021-09-11 DIAGNOSIS — R110 Nausea: Secondary | ICD-10-CM | POA: Diagnosis not present

## 2021-09-11 DIAGNOSIS — R1013 Epigastric pain: Secondary | ICD-10-CM | POA: Diagnosis not present

## 2021-09-26 DIAGNOSIS — E039 Hypothyroidism, unspecified: Secondary | ICD-10-CM | POA: Diagnosis not present

## 2021-09-26 DIAGNOSIS — E049 Nontoxic goiter, unspecified: Secondary | ICD-10-CM | POA: Diagnosis not present

## 2021-09-30 DIAGNOSIS — E049 Nontoxic goiter, unspecified: Secondary | ICD-10-CM | POA: Diagnosis not present

## 2021-10-08 DIAGNOSIS — E040 Nontoxic diffuse goiter: Secondary | ICD-10-CM | POA: Diagnosis not present

## 2021-11-05 DIAGNOSIS — S9032XS Contusion of left foot, sequela: Secondary | ICD-10-CM | POA: Diagnosis not present

## 2021-11-05 DIAGNOSIS — G8929 Other chronic pain: Secondary | ICD-10-CM | POA: Diagnosis not present

## 2021-11-05 DIAGNOSIS — M79672 Pain in left foot: Secondary | ICD-10-CM | POA: Diagnosis not present

## 2021-11-27 DIAGNOSIS — R202 Paresthesia of skin: Secondary | ICD-10-CM | POA: Diagnosis not present

## 2021-12-16 DIAGNOSIS — B070 Plantar wart: Secondary | ICD-10-CM | POA: Diagnosis not present

## 2022-01-07 DIAGNOSIS — K5900 Constipation, unspecified: Secondary | ICD-10-CM | POA: Diagnosis not present

## 2022-01-16 DIAGNOSIS — R1032 Left lower quadrant pain: Secondary | ICD-10-CM | POA: Diagnosis not present

## 2022-01-16 DIAGNOSIS — K5909 Other constipation: Secondary | ICD-10-CM | POA: Diagnosis not present

## 2022-02-13 DIAGNOSIS — M7918 Myalgia, other site: Secondary | ICD-10-CM | POA: Diagnosis not present

## 2022-02-13 DIAGNOSIS — N809 Endometriosis, unspecified: Secondary | ICD-10-CM | POA: Diagnosis not present

## 2022-02-13 DIAGNOSIS — N94819 Vulvodynia, unspecified: Secondary | ICD-10-CM | POA: Diagnosis not present

## 2022-02-13 DIAGNOSIS — N941 Unspecified dyspareunia: Secondary | ICD-10-CM | POA: Diagnosis not present

## 2022-03-06 DIAGNOSIS — N809 Endometriosis, unspecified: Secondary | ICD-10-CM | POA: Diagnosis not present

## 2022-03-30 DIAGNOSIS — M7918 Myalgia, other site: Secondary | ICD-10-CM | POA: Diagnosis not present

## 2022-03-30 DIAGNOSIS — N941 Unspecified dyspareunia: Secondary | ICD-10-CM | POA: Diagnosis not present

## 2022-03-30 DIAGNOSIS — N94819 Vulvodynia, unspecified: Secondary | ICD-10-CM | POA: Diagnosis not present

## 2022-03-30 DIAGNOSIS — N809 Endometriosis, unspecified: Secondary | ICD-10-CM | POA: Diagnosis not present

## 2022-04-10 DIAGNOSIS — E049 Nontoxic goiter, unspecified: Secondary | ICD-10-CM | POA: Diagnosis not present

## 2022-04-10 DIAGNOSIS — M064 Inflammatory polyarthropathy: Secondary | ICD-10-CM | POA: Diagnosis not present

## 2022-04-10 DIAGNOSIS — J45909 Unspecified asthma, uncomplicated: Secondary | ICD-10-CM | POA: Diagnosis not present

## 2022-05-05 DIAGNOSIS — R102 Pelvic and perineal pain: Secondary | ICD-10-CM | POA: Diagnosis not present

## 2022-05-05 DIAGNOSIS — N939 Abnormal uterine and vaginal bleeding, unspecified: Secondary | ICD-10-CM | POA: Diagnosis not present

## 2022-06-26 DIAGNOSIS — E039 Hypothyroidism, unspecified: Secondary | ICD-10-CM | POA: Diagnosis not present

## 2022-06-26 DIAGNOSIS — E040 Nontoxic diffuse goiter: Secondary | ICD-10-CM | POA: Diagnosis not present

## 2022-07-10 DIAGNOSIS — N809 Endometriosis, unspecified: Secondary | ICD-10-CM | POA: Diagnosis not present

## 2022-07-10 DIAGNOSIS — M7918 Myalgia, other site: Secondary | ICD-10-CM | POA: Diagnosis not present

## 2022-07-16 DIAGNOSIS — L814 Other melanin hyperpigmentation: Secondary | ICD-10-CM | POA: Diagnosis not present

## 2022-07-16 DIAGNOSIS — D485 Neoplasm of uncertain behavior of skin: Secondary | ICD-10-CM | POA: Diagnosis not present

## 2022-07-16 DIAGNOSIS — D225 Melanocytic nevi of trunk: Secondary | ICD-10-CM | POA: Diagnosis not present

## 2022-07-16 DIAGNOSIS — L578 Other skin changes due to chronic exposure to nonionizing radiation: Secondary | ICD-10-CM | POA: Diagnosis not present

## 2022-07-17 DIAGNOSIS — J019 Acute sinusitis, unspecified: Secondary | ICD-10-CM | POA: Diagnosis not present

## 2022-08-04 DIAGNOSIS — O26891 Other specified pregnancy related conditions, first trimester: Secondary | ICD-10-CM | POA: Diagnosis not present

## 2022-08-04 DIAGNOSIS — Z3490 Encounter for supervision of normal pregnancy, unspecified, unspecified trimester: Secondary | ICD-10-CM | POA: Diagnosis not present

## 2022-08-04 DIAGNOSIS — R188 Other ascites: Secondary | ICD-10-CM | POA: Diagnosis not present

## 2022-08-04 DIAGNOSIS — R109 Unspecified abdominal pain: Secondary | ICD-10-CM | POA: Diagnosis not present

## 2022-08-04 DIAGNOSIS — Z3A01 Less than 8 weeks gestation of pregnancy: Secondary | ICD-10-CM | POA: Diagnosis not present

## 2022-08-08 DIAGNOSIS — O209 Hemorrhage in early pregnancy, unspecified: Secondary | ICD-10-CM | POA: Diagnosis not present

## 2022-08-08 DIAGNOSIS — Z3A01 Less than 8 weeks gestation of pregnancy: Secondary | ICD-10-CM | POA: Diagnosis not present

## 2022-08-08 DIAGNOSIS — Z3201 Encounter for pregnancy test, result positive: Secondary | ICD-10-CM | POA: Diagnosis not present

## 2022-08-08 DIAGNOSIS — N938 Other specified abnormal uterine and vaginal bleeding: Secondary | ICD-10-CM | POA: Diagnosis not present

## 2022-08-08 DIAGNOSIS — O26891 Other specified pregnancy related conditions, first trimester: Secondary | ICD-10-CM | POA: Diagnosis not present

## 2022-08-10 DIAGNOSIS — O3680X9 Pregnancy with inconclusive fetal viability, other fetus: Secondary | ICD-10-CM | POA: Diagnosis not present

## 2022-08-10 DIAGNOSIS — N911 Secondary amenorrhea: Secondary | ICD-10-CM | POA: Diagnosis not present

## 2022-08-17 DIAGNOSIS — O039 Complete or unspecified spontaneous abortion without complication: Secondary | ICD-10-CM | POA: Diagnosis not present

## 2022-08-21 DIAGNOSIS — D485 Neoplasm of uncertain behavior of skin: Secondary | ICD-10-CM | POA: Diagnosis not present

## 2022-08-28 DIAGNOSIS — O021 Missed abortion: Secondary | ICD-10-CM | POA: Diagnosis not present

## 2022-09-11 DIAGNOSIS — Z20828 Contact with and (suspected) exposure to other viral communicable diseases: Secondary | ICD-10-CM | POA: Diagnosis not present

## 2022-09-11 DIAGNOSIS — J069 Acute upper respiratory infection, unspecified: Secondary | ICD-10-CM | POA: Diagnosis not present

## 2022-09-11 DIAGNOSIS — U071 COVID-19: Secondary | ICD-10-CM | POA: Diagnosis not present

## 2022-09-18 DIAGNOSIS — O021 Missed abortion: Secondary | ICD-10-CM | POA: Diagnosis not present

## 2022-09-30 DIAGNOSIS — D485 Neoplasm of uncertain behavior of skin: Secondary | ICD-10-CM | POA: Diagnosis not present

## 2022-10-12 HISTORY — PX: LAPAROSCOPY: SHX197

## 2022-10-16 DIAGNOSIS — E038 Other specified hypothyroidism: Secondary | ICD-10-CM | POA: Diagnosis not present

## 2022-10-16 DIAGNOSIS — Z1321 Encounter for screening for nutritional disorder: Secondary | ICD-10-CM | POA: Diagnosis not present

## 2022-10-16 DIAGNOSIS — Z0000 Encounter for general adult medical examination without abnormal findings: Secondary | ICD-10-CM | POA: Diagnosis not present

## 2022-10-16 DIAGNOSIS — Z131 Encounter for screening for diabetes mellitus: Secondary | ICD-10-CM | POA: Diagnosis not present

## 2022-10-16 DIAGNOSIS — Z13220 Encounter for screening for lipoid disorders: Secondary | ICD-10-CM | POA: Diagnosis not present

## 2022-10-27 DIAGNOSIS — N803 Endometriosis of pelvic peritoneum, unspecified: Secondary | ICD-10-CM | POA: Diagnosis not present

## 2022-10-27 DIAGNOSIS — N80352 Endometriosis of the left pelvic sidewall, unspecified depth: Secondary | ICD-10-CM | POA: Diagnosis not present

## 2022-10-27 DIAGNOSIS — G8929 Other chronic pain: Secondary | ICD-10-CM | POA: Diagnosis not present

## 2022-10-27 DIAGNOSIS — N83291 Other ovarian cyst, right side: Secondary | ICD-10-CM | POA: Diagnosis not present

## 2022-10-27 DIAGNOSIS — N809 Endometriosis, unspecified: Secondary | ICD-10-CM | POA: Diagnosis not present

## 2022-10-27 DIAGNOSIS — R102 Pelvic and perineal pain: Secondary | ICD-10-CM | POA: Diagnosis not present

## 2022-10-27 DIAGNOSIS — Z79899 Other long term (current) drug therapy: Secondary | ICD-10-CM | POA: Diagnosis not present

## 2022-10-27 DIAGNOSIS — Z79890 Hormone replacement therapy (postmenopausal): Secondary | ICD-10-CM | POA: Diagnosis not present

## 2022-10-27 DIAGNOSIS — N946 Dysmenorrhea, unspecified: Secondary | ICD-10-CM | POA: Diagnosis not present

## 2022-10-27 DIAGNOSIS — N80311 Superficial endometriosis of the anterior cul-de-sac: Secondary | ICD-10-CM | POA: Diagnosis not present

## 2022-11-18 DIAGNOSIS — H1032 Unspecified acute conjunctivitis, left eye: Secondary | ICD-10-CM | POA: Diagnosis not present

## 2022-11-20 DIAGNOSIS — B309 Viral conjunctivitis, unspecified: Secondary | ICD-10-CM | POA: Diagnosis not present

## 2022-12-18 DIAGNOSIS — N809 Endometriosis, unspecified: Secondary | ICD-10-CM | POA: Diagnosis not present

## 2022-12-18 DIAGNOSIS — N393 Stress incontinence (female) (male): Secondary | ICD-10-CM | POA: Diagnosis not present

## 2022-12-18 DIAGNOSIS — R102 Pelvic and perineal pain: Secondary | ICD-10-CM | POA: Diagnosis not present

## 2022-12-18 DIAGNOSIS — M7918 Myalgia, other site: Secondary | ICD-10-CM | POA: Diagnosis not present

## 2022-12-25 DIAGNOSIS — N809 Endometriosis, unspecified: Secondary | ICD-10-CM | POA: Diagnosis not present

## 2022-12-25 DIAGNOSIS — R102 Pelvic and perineal pain: Secondary | ICD-10-CM | POA: Diagnosis not present

## 2022-12-25 DIAGNOSIS — N393 Stress incontinence (female) (male): Secondary | ICD-10-CM | POA: Diagnosis not present

## 2022-12-25 DIAGNOSIS — M7918 Myalgia, other site: Secondary | ICD-10-CM | POA: Diagnosis not present

## 2023-01-01 DIAGNOSIS — M7918 Myalgia, other site: Secondary | ICD-10-CM | POA: Diagnosis not present

## 2023-01-01 DIAGNOSIS — R102 Pelvic and perineal pain: Secondary | ICD-10-CM | POA: Diagnosis not present

## 2023-01-01 DIAGNOSIS — N809 Endometriosis, unspecified: Secondary | ICD-10-CM | POA: Diagnosis not present

## 2023-01-01 DIAGNOSIS — N393 Stress incontinence (female) (male): Secondary | ICD-10-CM | POA: Diagnosis not present

## 2023-01-08 DIAGNOSIS — M7918 Myalgia, other site: Secondary | ICD-10-CM | POA: Diagnosis not present

## 2023-01-08 DIAGNOSIS — N393 Stress incontinence (female) (male): Secondary | ICD-10-CM | POA: Diagnosis not present

## 2023-01-08 DIAGNOSIS — N809 Endometriosis, unspecified: Secondary | ICD-10-CM | POA: Diagnosis not present

## 2023-01-08 DIAGNOSIS — R102 Pelvic and perineal pain: Secondary | ICD-10-CM | POA: Diagnosis not present

## 2023-01-21 DIAGNOSIS — E038 Other specified hypothyroidism: Secondary | ICD-10-CM | POA: Diagnosis not present

## 2023-01-21 DIAGNOSIS — R1310 Dysphagia, unspecified: Secondary | ICD-10-CM | POA: Diagnosis not present

## 2023-01-22 DIAGNOSIS — R102 Pelvic and perineal pain: Secondary | ICD-10-CM | POA: Diagnosis not present

## 2023-01-22 DIAGNOSIS — N809 Endometriosis, unspecified: Secondary | ICD-10-CM | POA: Diagnosis not present

## 2023-01-22 DIAGNOSIS — M7918 Myalgia, other site: Secondary | ICD-10-CM | POA: Diagnosis not present

## 2023-01-22 DIAGNOSIS — N393 Stress incontinence (female) (male): Secondary | ICD-10-CM | POA: Diagnosis not present

## 2023-01-28 DIAGNOSIS — R1319 Other dysphagia: Secondary | ICD-10-CM | POA: Diagnosis not present

## 2023-01-28 DIAGNOSIS — R1310 Dysphagia, unspecified: Secondary | ICD-10-CM | POA: Diagnosis not present

## 2023-02-01 DIAGNOSIS — R1310 Dysphagia, unspecified: Secondary | ICD-10-CM | POA: Diagnosis not present

## 2023-02-01 DIAGNOSIS — K625 Hemorrhage of anus and rectum: Secondary | ICD-10-CM | POA: Diagnosis not present

## 2023-02-01 DIAGNOSIS — K5900 Constipation, unspecified: Secondary | ICD-10-CM | POA: Diagnosis not present

## 2023-04-16 DIAGNOSIS — M25531 Pain in right wrist: Secondary | ICD-10-CM | POA: Diagnosis not present

## 2023-04-16 DIAGNOSIS — M79644 Pain in right finger(s): Secondary | ICD-10-CM | POA: Diagnosis not present

## 2023-04-16 DIAGNOSIS — S60931A Unspecified superficial injury of right thumb, initial encounter: Secondary | ICD-10-CM | POA: Diagnosis not present

## 2023-04-16 DIAGNOSIS — S63501A Unspecified sprain of right wrist, initial encounter: Secondary | ICD-10-CM | POA: Diagnosis not present

## 2023-05-28 DIAGNOSIS — J4520 Mild intermittent asthma, uncomplicated: Secondary | ICD-10-CM | POA: Diagnosis not present

## 2023-05-28 DIAGNOSIS — E041 Nontoxic single thyroid nodule: Secondary | ICD-10-CM | POA: Diagnosis not present

## 2023-06-02 DIAGNOSIS — Z041 Encounter for examination and observation following transport accident: Secondary | ICD-10-CM | POA: Diagnosis not present

## 2023-06-02 DIAGNOSIS — O26891 Other specified pregnancy related conditions, first trimester: Secondary | ICD-10-CM | POA: Diagnosis not present

## 2023-06-02 DIAGNOSIS — Z3A01 Less than 8 weeks gestation of pregnancy: Secondary | ICD-10-CM | POA: Diagnosis not present

## 2023-06-03 ENCOUNTER — Ambulatory Visit (INDEPENDENT_AMBULATORY_CARE_PROVIDER_SITE_OTHER): Payer: BC Managed Care – PPO | Admitting: Allergy and Immunology

## 2023-06-03 ENCOUNTER — Encounter: Payer: Self-pay | Admitting: Allergy and Immunology

## 2023-06-03 VITALS — BP 110/78 | HR 84 | Resp 18 | Ht 63.3 in | Wt 136.4 lb

## 2023-06-03 DIAGNOSIS — J3089 Other allergic rhinitis: Secondary | ICD-10-CM | POA: Diagnosis not present

## 2023-06-03 DIAGNOSIS — J4530 Mild persistent asthma, uncomplicated: Secondary | ICD-10-CM | POA: Diagnosis not present

## 2023-06-03 DIAGNOSIS — Z3A01 Less than 8 weeks gestation of pregnancy: Secondary | ICD-10-CM

## 2023-06-03 MED ORDER — PULMICORT FLEXHALER 180 MCG/ACT IN AEPB: INHALATION_SPRAY | RESPIRATORY_TRACT | 5 refills | Status: DC

## 2023-06-03 NOTE — Progress Notes (Signed)
[place] [facility] [place] [facility] [place] - [place]   NEW PATIENT NOTE  Referring Provider: Doreene Eland, MD Primary Provider: Doreene Eland, MD Date of office visit: 06/03/2023    Subjective:   Chief Complaint:  Margaret Levine (DOB: January 03, 1996) is a 27 y.o. female who presents to the clinic on 06/03/2023 with a chief complaint of Asthma .     HPI: Margaret Levine presents to this clinic in evaluation of respiratory tract issues.  I had seen her in this clinic about 12 years ago for an issue with asthma and allergic rhinitis.  She has really done well since I have last seen her in this clinic and it would only be during cold air exposure or possibly exercising in the cold or some seasonal exposures when she would develop a little bit of cough and some nasal issues but overall it did not really bother her very much and she did not use any medications to address that issue.  But over the course of the past month or so she has noticed a little bit more problem with a cough especially if she exerts herself especially in the cold air and not only did she get some cough but she gets some wheezing and some shortness of breath.  And she has developed some stuffiness in the morning and she is get some postnasal drip and some throat clearing.  She does not have any anosmia or ugly nasal discharge or raspy voice or chest pain or symptoms suggesting reflux.  There is not an obvious provoking factor giving rise to these increased respiratory tract symptoms.  She did move about a month ago but her current house is a similar type of house in a similar type of setting compared to her previous house.  She is [redacted] weeks pregnant.  Past Medical History:  Diagnosis Date   Asthma    Endometriosis    Hypothyroidism     Past Surgical History:  Procedure Laterality Date   APPENDECTOMY  2019   CESAREAN SECTION  2019   LAPAROSCOPY  10/2022   LAPAROSCOPY  2019   WISDOM TOOTH EXTRACTION       Allergies as of 06/03/2023       Reactions   Amitriptyline Other (See Comments)   Non-allergic drug reaction, head pain        Medication List    Airsupra 90-80 MCG/ACT Aero Generic drug: Albuterol-Budesonide 2 puffs as needed Inhalation Six times a day for 90 days     Review of systems negative except as noted in HPI / PMHx or noted below:  Review of Systems  Constitutional: Negative.   HENT: Negative.    Eyes: Negative.   Respiratory: Negative.    Cardiovascular: Negative.   Gastrointestinal: Negative.   Genitourinary: Negative.   Musculoskeletal: Negative.   Skin: Negative.   Neurological: Negative.   Endo/Heme/Allergies: Negative.   Psychiatric/Behavioral: Negative.      Family History  Problem Relation Age of Onset   Diabetes Mother    Thyroid disease Father    Thyroid disease Sister    Diabetes Brother    Diabetes Paternal Grandmother    Asthma Son     Social History   Socioeconomic History   Marital status: Single    Spouse name: Not on file   Number of children: Not on file   Years of education: Not on file   Highest education level: Not on file  Occupational History   Not on file  Tobacco Use   Smoking status: Never   Smokeless tobacco: Never  Vaping Use   Vaping status: Never Used  Substance and Sexual Activity   Alcohol use: No   Drug use: No   Sexual activity: Yes    Birth control/protection: None  Other Topics Concern   Not on file  Social History Narrative   Not on file   Environmental and Social history  Lives in a house with a dry environment, no animals located inside the household, no carpet in the bedroom, plastic on the bed, no plastic on the pillow, no smoking ongoing with inside the household.  She works in a office setting.  Objective:   Vitals:   06/03/23 1438  BP: 110/78  Pulse: 84  Resp: 18  SpO2: 97%   Height: 5' 3.3" (160.8 cm) Weight: 136 lb 6.4 oz (61.9 kg)  Physical Exam Constitutional:       Appearance: She is not diaphoretic.  HENT:     Head: Normocephalic.     Right Ear: Tympanic membrane, ear canal and external ear normal.     Left Ear: Tympanic membrane, ear canal and external ear normal.     Nose: Nose normal. No mucosal edema or rhinorrhea.     Mouth/Throat:     Pharynx: Uvula midline. No oropharyngeal exudate.  Eyes:     Conjunctiva/sclera: Conjunctivae normal.  Neck:     Thyroid: No thyromegaly.     Trachea: Trachea normal. No tracheal tenderness or tracheal deviation.  Cardiovascular:     Rate and Rhythm: Normal rate and regular rhythm.     Heart sounds: Normal heart sounds, S1 normal and S2 normal. No murmur heard. Pulmonary:     Effort: No respiratory distress.     Breath sounds: Normal breath sounds. No stridor. No wheezing or rales.  Lymphadenopathy:     Head:     Right side of head: No tonsillar adenopathy.     Left side of head: No tonsillar adenopathy.     Cervical: No cervical adenopathy.  Skin:    Findings: No erythema or rash.     Nails: There is no clubbing.  Neurological:     Mental Status: She is alert.     Diagnostics: Allergy skin tests were not performed.   Spirometry was performed and demonstrated an FEV1 of 2.97 @ 93 % of predicted. FEV1/FVC = 0.85  Assessment and Plan:    1. Not well controlled mild persistent asthma   2. Perennial allergic rhinitis   3. Less than [redacted] weeks gestation of pregnancy    1. Treat and prevent inflammation of airway:   A. Pulmicort 180 - 1 inhalation 1 time per day (empty lungs)  B. OTC Rhinocort - 1 spray each nostril 1 time per day  2. If needed:   A. Claritin / loratadine 10 mg - 1 tablet 1 time per day  B. Airsupra - 2 inhalations every 6 hours  3. Return to clinic in 3 weeks or earlier if problem  4. Flu vaccine / RSV vaccine during pregnancy  Margaret Levine appears to have some inflammation of her airway and we will give her a very low-dose of inhaled and nasal budesonide as noted above and there  is a series of agents that she can utilize should they be required as noted above.  I will see her back in this clinic in 3 weeks to assess her response to this approach.  I also talked to her about obtaining a flu  vaccine and RSV vaccine at some point in her pregnancy and she can continue this discussion with her obstetrician during her upcoming visit.  Jessica Priest, MD Allergy / Immunology [facility]

## 2023-06-03 NOTE — Patient Instructions (Addendum)
  1. Treat and prevent inflammation of airway:   A. Pulmicort 180 - 1 inhalation 1 time per day (empty lungs)  B. OTC Rhinocort - 1 spray each nostril 1 time per day  2. If needed:   A. Claritin / loratadine 10 mg - 1 tablet 1 time per day  B. Airsupra - 2 inhalations every 6 hours  3. Return to clinic in 3 weeks or earlier if problem  4. Flu vaccine / RSV vaccine during pregnancy

## 2023-06-04 ENCOUNTER — Telehealth: Payer: Self-pay

## 2023-06-04 ENCOUNTER — Other Ambulatory Visit (HOSPITAL_COMMUNITY): Payer: Self-pay

## 2023-06-04 NOTE — Telephone Encounter (Signed)
*  Asthma/Allergy  Pharmacy Patient Advocate Encounter   Received notification from CoverMyMeds that prior authorization for Pulmicort Flexhaler 180MCG/ACT aerosol powder  is required/requested.   Insurance verification completed.   The patient is insured through [employer] [facility] .   Per test claim: PA required; PA started via CoverMyMeds. KEY [id number] . Waiting for clinical questions to populate.

## 2023-06-04 NOTE — Telephone Encounter (Signed)
Clinical questions populated and submitted, patient previously on [place].

## 2023-06-07 ENCOUNTER — Inpatient Hospital Stay (HOSPITAL_COMMUNITY): Payer: BC Managed Care – PPO

## 2023-06-07 ENCOUNTER — Encounter (HOSPITAL_COMMUNITY)
Admission: AD | Disposition: A | Discharge status: Home / Self Care | Payer: Self-pay | Source: Home / Self Care | Attending: Obstetrics and Gynecology

## 2023-06-07 ENCOUNTER — Inpatient Hospital Stay (HOSPITAL_COMMUNITY)
Admission: AD | Admit: 2023-06-07 | Discharge: 2023-06-08 | Disposition: A | Discharge status: Home / Self Care | Payer: BC Managed Care – PPO | Attending: Obstetrics and Gynecology | Admitting: Obstetrics and Gynecology

## 2023-06-07 ENCOUNTER — Encounter (HOSPITAL_COMMUNITY): Payer: Self-pay | Admitting: Obstetrics and Gynecology

## 2023-06-07 ENCOUNTER — Encounter: Payer: Self-pay | Admitting: Allergy and Immunology

## 2023-06-07 DIAGNOSIS — J45909 Unspecified asthma, uncomplicated: Secondary | ICD-10-CM | POA: Insufficient documentation

## 2023-06-07 DIAGNOSIS — N836 Hematosalpinx: Secondary | ICD-10-CM | POA: Diagnosis not present

## 2023-06-07 DIAGNOSIS — Z3A01 Less than 8 weeks gestation of pregnancy: Secondary | ICD-10-CM

## 2023-06-07 DIAGNOSIS — O00102 Left tubal pregnancy without intrauterine pregnancy: Secondary | ICD-10-CM | POA: Diagnosis not present

## 2023-06-07 DIAGNOSIS — O26899 Other specified pregnancy related conditions, unspecified trimester: Secondary | ICD-10-CM | POA: Diagnosis not present

## 2023-06-07 DIAGNOSIS — Z7951 Long term (current) use of inhaled steroids: Secondary | ICD-10-CM | POA: Diagnosis not present

## 2023-06-07 DIAGNOSIS — N911 Secondary amenorrhea: Secondary | ICD-10-CM | POA: Diagnosis not present

## 2023-06-07 HISTORY — PX: DIAGNOSTIC LAPAROSCOPY WITH REMOVAL OF ECTOPIC PREGNANCY: SHX6449

## 2023-06-07 LAB — HEPATIC FUNCTION PANEL
ALT: 16 U/L (ref 0–44)
AST: 15 U/L (ref 15–41)
Albumin: 4.2 g/dL (ref 3.5–5.0)
Alkaline Phosphatase: 42 U/L (ref 38–126)
Bilirubin, Direct: <0.1 mg/dL (ref 0.0–0.2)
Total Bilirubin: 0.6 mg/dL (ref <1.2)
Total Protein: 6.9 g/dL (ref 6.5–8.1)

## 2023-06-07 LAB — URINALYSIS, ROUTINE W REFLEX MICROSCOPIC
Bilirubin Urine: NEGATIVE
Glucose, UA: NEGATIVE mg/dL
Ketones, ur: 20 mg/dL — AB
Nitrite: NEGATIVE
Protein, ur: NEGATIVE mg/dL
RBC / HPF: >50 RBC/hpf (ref 0–5)
Specific Gravity, Urine: 1.017 (ref 1.005–1.030)
pH: 8 (ref 5.0–8.0)

## 2023-06-07 LAB — CBC
HCT: 36.3 % (ref 36.0–46.0)
Hemoglobin: 12 g/dL (ref 12.0–15.0)
MCH: 30.2 pg (ref 26.0–34.0)
MCHC: 33.1 g/dL (ref 30.0–36.0)
MCV: 91.4 fL (ref 80.0–100.0)
Platelets: 298 10*3/uL (ref 150–400)
RBC: 3.97 MIL/uL (ref 3.87–5.11)
RDW: 12 % (ref 11.5–15.5)
WBC: 11.7 10*3/uL — ABNORMAL HIGH (ref 4.0–10.5)
nRBC: 0 % (ref 0.0–0.2)

## 2023-06-07 LAB — BASIC METABOLIC PANEL
Anion gap: 9 (ref 5–15)
BUN: 7 mg/dL (ref 6–20)
CO2: 20 mmol/L — ABNORMAL LOW (ref 22–32)
Calcium: 9.2 mg/dL (ref 8.9–10.3)
Chloride: 103 mmol/L (ref 98–111)
Creatinine, Ser: 0.48 mg/dL (ref 0.44–1.00)
GFR, Estimated: >60 mL/min (ref >60)
Glucose, Bld: 112 mg/dL — ABNORMAL HIGH (ref 70–99)
Potassium: 3.4 mmol/L — ABNORMAL LOW (ref 3.5–5.1)
Sodium: 132 mmol/L — ABNORMAL LOW (ref 135–145)

## 2023-06-07 LAB — TYPE AND SCREEN
ABO/RH(D): A POS
Antibody Screen: NEGATIVE

## 2023-06-07 LAB — HCG, QUANTITATIVE, PREGNANCY: hCG, Beta Chain, Quant, S: 1311 m[IU]/mL — ABNORMAL HIGH (ref <5)

## 2023-06-07 SURGERY — LAPAROSCOPY, WITH ECTOPIC PREGNANCY SURGICAL TREATMENT
Anesthesia: General

## 2023-06-07 MED ORDER — SUCCINYLCHOLINE CHLORIDE 200 MG/10ML IV SOSY
PREFILLED_SYRINGE | INTRAVENOUS | Status: DC | PRN
  Administered 2023-06-07: 60 mg via INTRAVENOUS

## 2023-06-07 MED ORDER — SODIUM CHLORIDE 0.9 % IR SOLN
Status: DC | PRN
  Administered 2023-06-07: 1000 mL

## 2023-06-07 MED ORDER — SUGAMMADEX SODIUM 200 MG/2ML IV SOLN
INTRAVENOUS | Status: DC | PRN
  Administered 2023-06-07: 200 mg via INTRAVENOUS

## 2023-06-07 MED ORDER — 0.9 % SODIUM CHLORIDE (POUR BTL) OPTIME
TOPICAL | Status: DC | PRN
  Administered 2023-06-07: 1000 mL

## 2023-06-07 MED ORDER — GLYCOPYRROLATE PF 0.2 MG/ML IJ SOSY
PREFILLED_SYRINGE | INTRAMUSCULAR | Status: AC
  Filled 2023-06-07: qty 1

## 2023-06-07 MED ORDER — OXYCODONE HCL 5 MG PO TABS: 5.0000 mg | ORAL_TABLET | Freq: Once | ORAL | Status: DC | PRN

## 2023-06-07 MED ORDER — BUPIVACAINE HCL (PF) 0.25 % IJ SOLN
INTRAMUSCULAR | Status: DC | PRN
  Administered 2023-06-07: 6 mL

## 2023-06-07 MED ORDER — PHENYLEPHRINE 80 MCG/ML (10ML) SYRINGE FOR IV PUSH (FOR BLOOD PRESSURE SUPPORT)
PREFILLED_SYRINGE | INTRAVENOUS | Status: AC
  Filled 2023-06-07: qty 10

## 2023-06-07 MED ORDER — PROPOFOL 10 MG/ML IV BOLUS
INTRAVENOUS | Status: AC
  Filled 2023-06-07: qty 20

## 2023-06-07 MED ORDER — MIDAZOLAM HCL 2 MG/2ML IJ SOLN
INTRAMUSCULAR | Status: DC | PRN
  Administered 2023-06-07: 2 mg via INTRAVENOUS

## 2023-06-07 MED ORDER — FENTANYL CITRATE (PF) 100 MCG/2ML IJ SOLN
25.0000 ug | INTRAMUSCULAR | Status: DC | PRN
  Administered 2023-06-07 (×2): 50 ug via INTRAVENOUS

## 2023-06-07 MED ORDER — ALBUMIN HUMAN 5 % IV SOLN
12.5000 g | Freq: Once | INTRAVENOUS | Status: AC
  Administered 2023-06-07: 12.5 g via INTRAVENOUS

## 2023-06-07 MED ORDER — ONDANSETRON HCL 4 MG/2ML IJ SOLN
INTRAMUSCULAR | Status: AC
  Filled 2023-06-07: qty 2

## 2023-06-07 MED ORDER — ROCURONIUM BROMIDE 10 MG/ML (PF) SYRINGE
PREFILLED_SYRINGE | INTRAVENOUS | Status: DC | PRN
  Administered 2023-06-07: 30 mg via INTRAVENOUS
  Administered 2023-06-07: 20 mg via INTRAVENOUS

## 2023-06-07 MED ORDER — CEFAZOLIN SODIUM-DEXTROSE 2-4 GM/100ML-% IV SOLN
INTRAVENOUS | Status: AC
  Filled 2023-06-07: qty 100

## 2023-06-07 MED ORDER — MIDAZOLAM HCL 2 MG/2ML IJ SOLN
INTRAMUSCULAR | Status: AC
Start: 2023-06-07 — End: ?
  Filled 2023-06-07: qty 2

## 2023-06-07 MED ORDER — DEXAMETHASONE SODIUM PHOSPHATE 10 MG/ML IJ SOLN
INTRAMUSCULAR | Status: DC | PRN
  Administered 2023-06-07: 10 mg via INTRAVENOUS

## 2023-06-07 MED ORDER — KETOROLAC TROMETHAMINE 15 MG/ML IJ SOLN
INTRAMUSCULAR | Status: DC | PRN
  Administered 2023-06-07: 15 mg via INTRAVENOUS

## 2023-06-07 MED ORDER — OXYCODONE HCL 5 MG/5ML PO SOLN: 5.0000 mg | Freq: Once | ORAL | Status: DC | PRN

## 2023-06-07 MED ORDER — FENTANYL CITRATE (PF) 250 MCG/5ML IJ SOLN
INTRAMUSCULAR | Status: DC | PRN
  Administered 2023-06-07: 100 ug via INTRAVENOUS
  Administered 2023-06-07 (×2): 50 ug via INTRAVENOUS

## 2023-06-07 MED ORDER — OXYCODONE HCL 5 MG PO TABS: 5.0000 mg | ORAL_TABLET | ORAL | 0 refills | Status: DC | PRN

## 2023-06-07 MED ORDER — ARTIFICIAL TEARS OPHTHALMIC OINT
TOPICAL_OINTMENT | OPHTHALMIC | Status: AC
  Filled 2023-06-07: qty 3.5

## 2023-06-07 MED ORDER — ACETAMINOPHEN 10 MG/ML IV SOLN
INTRAVENOUS | Status: AC
  Filled 2023-06-07: qty 100

## 2023-06-07 MED ORDER — IBUPROFEN 200 MG PO TABS: 600.0000 mg | ORAL_TABLET | Freq: Three times a day (TID) | ORAL | 0 refills | Status: AC | PRN

## 2023-06-07 MED ORDER — NEOSTIGMINE METHYLSULFATE 3 MG/3ML IV SOSY
PREFILLED_SYRINGE | INTRAVENOUS | Status: AC
  Filled 2023-06-07: qty 3

## 2023-06-07 MED ORDER — LIDOCAINE 2% (20 MG/ML) 5 ML SYRINGE
INTRAMUSCULAR | Status: AC
  Filled 2023-06-07: qty 5

## 2023-06-07 MED ORDER — FENTANYL CITRATE (PF) 100 MCG/2ML IJ SOLN
50.0000 ug | INTRAMUSCULAR | Status: DC | PRN
Start: 2023-06-07 — End: 2023-06-08
  Administered 2023-06-07: 100 ug via INTRAVENOUS
  Filled 2023-06-07: qty 2

## 2023-06-07 MED ORDER — BUPIVACAINE HCL (PF) 0.25 % IJ SOLN
INTRAMUSCULAR | Status: AC
Start: 2023-06-07 — End: ?
  Filled 2023-06-07: qty 30

## 2023-06-07 MED ORDER — EPHEDRINE 5 MG/ML INJ
INTRAVENOUS | Status: AC
  Filled 2023-06-07: qty 5

## 2023-06-07 MED ORDER — SUCCINYLCHOLINE CHLORIDE 200 MG/10ML IV SOSY
PREFILLED_SYRINGE | INTRAVENOUS | Status: AC
  Filled 2023-06-07: qty 10

## 2023-06-07 MED ORDER — LIDOCAINE HCL (CARDIAC) PF 100 MG/5ML IV SOSY
PREFILLED_SYRINGE | INTRAVENOUS | Status: DC | PRN
  Administered 2023-06-07: 60 mg via INTRATRACHEAL

## 2023-06-07 MED ORDER — LACTATED RINGERS IV SOLN: INTRAVENOUS | Status: DC | PRN

## 2023-06-07 MED ORDER — PHENYLEPHRINE 80 MCG/ML (10ML) SYRINGE FOR IV PUSH (FOR BLOOD PRESSURE SUPPORT)
PREFILLED_SYRINGE | INTRAVENOUS | Status: DC | PRN
  Administered 2023-06-07: 80 ug via INTRAVENOUS

## 2023-06-07 MED ORDER — FENTANYL CITRATE (PF) 100 MCG/2ML IJ SOLN
INTRAMUSCULAR | Status: AC
  Filled 2023-06-07: qty 2

## 2023-06-07 MED ORDER — ALBUMIN HUMAN 5 % IV SOLN
INTRAVENOUS | Status: AC
  Filled 2023-06-07: qty 250

## 2023-06-07 MED ORDER — ACETAMINOPHEN 10 MG/ML IV SOLN
1000.0000 mg | Freq: Once | INTRAVENOUS | Status: DC | PRN
  Administered 2023-06-07: 1000 mg via INTRAVENOUS

## 2023-06-07 MED ORDER — ACETAMINOPHEN 325 MG PO TABS: 650.0000 mg | ORAL_TABLET | Freq: Four times a day (QID) | ORAL | 0 refills | Status: AC | PRN

## 2023-06-07 MED ORDER — ROCURONIUM BROMIDE 10 MG/ML (PF) SYRINGE
PREFILLED_SYRINGE | INTRAVENOUS | Status: AC
  Filled 2023-06-07: qty 10

## 2023-06-07 MED ORDER — FENTANYL CITRATE (PF) 250 MCG/5ML IJ SOLN
INTRAMUSCULAR | Status: AC
  Filled 2023-06-07: qty 5

## 2023-06-07 MED ORDER — AMISULPRIDE (ANTIEMETIC) 5 MG/2ML IV SOLN
10.0000 mg | Freq: Once | INTRAVENOUS | Status: DC | PRN
Start: 2023-06-07 — End: 2023-06-08

## 2023-06-07 MED ORDER — ONDANSETRON HCL 4 MG/2ML IJ SOLN
INTRAMUSCULAR | Status: DC | PRN
  Administered 2023-06-07 (×2): 4 mg via INTRAVENOUS

## 2023-06-07 MED ORDER — PROPOFOL 10 MG/ML IV BOLUS
INTRAVENOUS | Status: DC | PRN
  Administered 2023-06-07: 150 mg via INTRAVENOUS

## 2023-06-07 SURGICAL SUPPLY — 25 items
BAG COUNTER SPONGE SURGICOUNT (BAG) ×1 IMPLANT
DERMABOND ADVANCED .7 DNX12 (GAUZE/BANDAGES/DRESSINGS) ×1 IMPLANT
DERMABOND ADVANCED .7 DNX6 (GAUZE/BANDAGES/DRESSINGS) IMPLANT
DRSG OPSITE POSTOP 3X4 (GAUZE/BANDAGES/DRESSINGS) ×1 IMPLANT
DURAPREP 26ML APPLICATOR (WOUND CARE) ×1 IMPLANT
ELECT REM PT RETURN 9FT ADLT (ELECTROSURGICAL) ×1
ELECTRODE REM PT RTRN 9FT ADLT (ELECTROSURGICAL) ×1 IMPLANT
GLOVE BIO SURGEON STRL SZ7 (GLOVE) ×1 IMPLANT
GLOVE BIOGEL PI IND STRL 7.0 (GLOVE) ×3 IMPLANT
GOWN STRL REUS W/ TWL LRG LVL3 (GOWN DISPOSABLE) ×2 IMPLANT
KIT TURNOVER KIT B (KITS) ×1 IMPLANT
LIGASURE VESSEL 5MM BLUNT TIP (ELECTROSURGICAL) IMPLANT
NS IRRIG 1000ML POUR BTL (IV SOLUTION) ×1 IMPLANT
PACK LAPAROSCOPY BASIN (CUSTOM PROCEDURE TRAY) ×1 IMPLANT
PROTECTOR NERVE ULNAR (MISCELLANEOUS) ×2 IMPLANT
SET TUBE SMOKE EVAC HIGH FLOW (TUBING) ×1 IMPLANT
SLEEVE Z-THREAD 5X100MM (TROCAR) ×1 IMPLANT
SUT MON AB 4-0 PS1 27 (SUTURE) IMPLANT
SUT VIC AB 3-0 PS2 18XBRD (SUTURE) ×1 IMPLANT
SUT VICRYL 0 UR6 27IN ABS (SUTURE) IMPLANT
TOWEL GREEN STERILE FF (TOWEL DISPOSABLE) ×2 IMPLANT
TROCAR OPTI TIP 5M 100M (ENDOMECHANICALS) ×1 IMPLANT
TROCAR XCEL DIL TIP R 11M (ENDOMECHANICALS) IMPLANT
TROCAR Z-THREAD OPTICAL 5X100M (TROCAR) IMPLANT
WARMER LAPAROSCOPE (MISCELLANEOUS) ×1 IMPLANT

## 2023-06-07 NOTE — MAU Note (Addendum)
Margaret Levine is a 27 y.o. at [redacted]w[redacted]d  here in MAU reporting: woke up with LLQ pain this morning. preg [facility], couldn't confirm IUP or ectopic.pain has gotten a little worse.  Had a little bleeding.   Onset of complaint: this morning Pain score: 8 Vitals:   06/07/23 1707 06/07/23 1710  BP:  116/64  Pulse:  89  Resp:  20  Temp:  98 F (36.7 C)  SpO2: 98% 98%      Lab orders placed from triage:

## 2023-06-07 NOTE — H&P (Signed)
Preoperative History and Physical  Margaret Levine is an 27 y.o. female. [id number] who presented to the MAU for evaluation of LLQ pain. She was seen in the office today for a confirmation ultrasound which showed a a L adnexa mass measuring about 1.9x1x1.3cm with hypoechoic center suspicious for left ectopic pregnancy. Additionally there was an irregular sac-like structure within the endometrial cavity with debris. No YS or FP was visualized.  Since she was seen at our office, she reports worsening of her LLQ pain, which is why she presented for evaluation. On arrival to MAU, reporting severe L lower back pain and LLQ pain, for which she received IV fentanyl. She reports episode of dark vaginal bleeding between her office visit and presentation, currently not having bleeding.   Pertinent Gynecological History: Contraception: none DES exposure: unknown Blood transfusions: none Sexually transmitted diseases: no past history Previous GYN Procedures:  laparoscopic excision of endometriosis, cesarean section   Last mammogram: N/A Last pap: Unsure - unknown per patient OB History: G3P1011   Past Medical History:  Diagnosis Date   Asthma    Endometriosis    Hypothyroidism     Past Surgical History:  Procedure Laterality Date   APPENDECTOMY  2019   CESAREAN SECTION  2019   LAPAROSCOPY  10/2022   LAPAROSCOPY  2019   WISDOM TOOTH EXTRACTION      Family History  Problem Relation Age of Onset   Diabetes Mother    Thyroid disease Father    Thyroid disease Sister    Diabetes Brother    Diabetes Paternal Grandmother    Asthma Son     Social History:  reports that she has never smoked. She has never used smokeless tobacco. She reports that she does not drink alcohol and does not use drugs.  Allergies:  Allergies  Allergen Reactions   Amitriptyline Other (See Comments)    Non-allergic drug reaction, head pain    Medications Prior to Admission  Medication Sig Dispense Refill Last Dose    Albuterol-Budesonide (AIRSUPRA) 90-80 MCG/ACT AERO 2 puffs as needed Inhalation Six times a day for 90 days (Patient not taking: Reported on 06/03/2023)      PULMICORT FLEXHALER 180 MCG/ACT inhaler Inhale one dose once daily to prevent cough or wheeze.  Rinse, gargle, and spit after use. 1 each 5     ROS otherwise negative unless previously mentioned in HPI     06/07/2023    5:10 PM 06/03/2023    2:38 PM 10/07/2017    2:30 AM  Vitals with BMI  Height 5\' 5"  5' 3.3"   Weight 136 lbs 11 oz 136 lbs 6 oz   BMI 22.75 23.93   Systolic 116 110 272  Diastolic 64 78 78  Pulse 89 84 70   Physical Exam Vitals reviewed.  HENT:     Head: Normocephalic and atraumatic.  Eyes:     Extraocular Movements: Extraocular movements intact.  Cardiovascular:     Rate and Rhythm: Normal rate.  Pulmonary:     Effort: Pulmonary effort is normal.  Abdominal:     General: There is no distension.     Palpations: Abdomen is soft.     Tenderness: There is abdominal tenderness in the left lower quadrant. There is no guarding or rebound.     Hernia: No hernia is present.  Skin:    General: Skin is warm and dry.  Neurological:     General: No focal deficit present.     Mental Status: She is  alert.  Psychiatric:        Mood and Affect: Mood normal.      Results for orders placed or performed during the hospital encounter of 06/07/23 (from the past 24 hour(s))  Type and screen  [facility]     Status: None   Collection Time: 06/07/23  5:51 PM  Result Value Ref Range   ABO/RH(D) A POS    Antibody Screen NEG    Sample Expiration      06/10/2023,2359 Performed at [facility], [address] [place], [place] [address]   CBC     Status: Abnormal   Collection Time: 06/07/23  5:52 PM  Result Value Ref Range   WBC 11.7 (H) 4.0 - 10.5 K/uL   RBC 3.97 3.87 - 5.11 MIL/uL   Hemoglobin 12.0 12.0 - 15.0 g/dL   HCT 82.9 56.2 - 13.0 %   MCV 91.4 80.0 - 100.0 fL   MCH 30.2 26.0 - 34.0  pg   MCHC 33.1 30.0 - 36.0 g/dL   RDW 86.5 78.4 - 69.6 %   Platelets 298 150 - 400 K/uL   nRBC 0.0 0.0 - 0.2 %  Basic metabolic panel     Status: Abnormal   Collection Time: 06/07/23  5:52 PM  Result Value Ref Range   Sodium 132 (L) 135 - 145 mmol/L   Potassium 3.4 (L) 3.5 - 5.1 mmol/L   Chloride 103 98 - 111 mmol/L   CO2 20 (L) 22 - 32 mmol/L   Glucose, Bld 112 (H) 70 - 99 mg/dL   BUN 7 6 - 20 mg/dL   Creatinine, Ser 2.95 0.44 - 1.00 mg/dL   Calcium 9.2 8.9 - 28.4 mg/dL   GFR, Estimated >13 >24 mL/min   Anion gap 9 5 - 15  hCG, quantitative, pregnancy     Status: Abnormal   Collection Time: 06/07/23  5:52 PM  Result Value Ref Range   hCG, Beta Chain, Quant, S 1,311 (H) <5 mIU/mL  Hepatic function panel     Status: None   Collection Time: 06/07/23  5:52 PM  Result Value Ref Range   Total Protein 6.9 6.5 - 8.1 g/dL   Albumin 4.2 3.5 - 5.0 g/dL   AST 15 15 - 41 U/L   ALT 16 0 - 44 U/L   Alkaline Phosphatase 42 38 - 126 U/L   Total Bilirubin 0.6 <1.2 mg/dL   Bilirubin, Direct <4.0 0.0 - 0.2 mg/dL   Indirect Bilirubin NOT CALCULATED 0.3 - 0.9 mg/dL  Urinalysis, Routine w reflex microscopic -Urine, Clean Catch     Status: Abnormal   Collection Time: 06/07/23  6:28 PM  Result Value Ref Range   Color, Urine YELLOW YELLOW   APPearance HAZY (A) CLEAR   Specific Gravity, Urine 1.017 1.005 - 1.030   pH 8.0 5.0 - 8.0   Glucose, UA NEGATIVE NEGATIVE mg/dL   Hgb urine dipstick LARGE (A) NEGATIVE   Bilirubin Urine NEGATIVE NEGATIVE   Ketones, ur 20 (A) NEGATIVE mg/dL   Protein, ur NEGATIVE NEGATIVE mg/dL   Nitrite NEGATIVE NEGATIVE   Leukocytes,Ua TRACE (A) NEGATIVE   RBC / HPF >50 0 - 5 RBC/hpf   WBC, UA 6-10 0 - 5 WBC/hpf   Bacteria, UA FEW (A) NONE SEEN   Squamous Epithelial / HPF 0-5 0 - 5 /HPF   Mucus PRESENT     US OB LESS THAN 14 WEEKS WITH OB TRANSVAGINAL  Addendum Date: 06/07/2023  ADDENDUM REPORT: 06/07/2023 18:56 ADDENDUM: These results were called by telephone  at the time of interpretation on 06/07/2023 at 6:56 pm to provider [facility] , who verbally acknowledged these results. Electronically Signed   By: Darliss Cheney M.D.   On: 06/07/2023 18:56   Result Date: 06/07/2023 CLINICAL DATA:  Pelvic pain during pregnancy EXAM: OBSTETRIC <14 WK [place] AND TRANSVAGINAL OB US TECHNIQUE: Both transabdominal and transvaginal ultrasound examinations were performed for complete evaluation of the gestation as well as the maternal uterus, adnexal regions, and pelvic cul-de-sac. Transvaginal technique was performed to assess early pregnancy. COMPARISON:  None Available. FINDINGS: Intrauterine gestational sac: None. Endometrium appears normal.  Cervix is closed. Right ovary: Appears normal measuring 2.9 x 2.4 x 2.4 cm. No adnexal mass. Left ovary: The left ovary measures 4.5 x 3.0 by 3.6 cm. There is a heterogeneous mass lateral and adjacent to the left ovary measuring 2.6 x 2.6 x 3.0 cm. Other: There is a moderate amount of free fluid containing echoes throughout the pelvis. IMPRESSION: 1. No intrauterine gestational sac identified. 2. Left adnexal mass measuring 2.6 x 2.6 x 3.0 cm. Moderate amount of complex free fluid in the pelvis, likely hemorrhage. Findings are concerning for left-sided ectopic pregnancy. Electronically Signed: By: Darliss Cheney M.D. On: 06/07/2023 18:53    Assessment/Plan: 27 yo G3P1011 presenting with worsened LLQ pain in setting of PUL on office ultrasound. Repeat TVUS in MAU suspicious for ruptured ectopic pregnancy given L sided pregnancy and complex free fluid in the pelvis concerning for hemorrhage.  I discussed with the patient and her partner my recommendation to proceed to the OR emergently for diagnostic laparoscopy, unilateral salpingectomy/removal of ectopic pregnancy for treatment of suspected ruptured ectopic. Risks discussed including infection, bleeding, damage to surrounding structures, need for additional procedures, need for open surgery.  All questions answered. Consents were signed. Likely for discharge from PACU. Main OR notified. Will proceed ASAP - OR team being called in.  Tawni Levy 06/07/2023, 7:41 PM

## 2023-06-07 NOTE — Anesthesia Preprocedure Evaluation (Addendum)
Anesthesia Evaluation  Patient identified by MRN, date of birth, ID band Patient awake    Reviewed: Allergy & Precautions, NPO status , Patient's Chart, lab work & pertinent test results  Airway Mallampati: I       Dental no notable dental hx.    Pulmonary asthma    Pulmonary exam normal        Cardiovascular negative cardio ROS Normal cardiovascular exam     Neuro/Psych negative neurological ROS  negative psych ROS   GI/Hepatic negative GI ROS, Neg liver ROS,,,  Endo/Other  negative endocrine ROS    Renal/GU negative Renal ROS     Musculoskeletal negative musculoskeletal ROS (+)    Abdominal   Peds  Hematology negative hematology ROS (+)   Anesthesia Other Findings ectopic pregnancy  Reproductive/Obstetrics                             Anesthesia Physical Anesthesia Plan  ASA: 2 and emergent  Anesthesia Plan: General   Post-op Pain Management:    Induction: Intravenous  PONV Risk Score and Plan: 3 and Ondansetron, Dexamethasone, Midazolam and Treatment may vary due to age or medical condition  Airway Management Planned: Oral ETT  Additional Equipment:   Intra-op Plan:   Post-operative Plan: Extubation in OR  Informed Consent: I have reviewed the patients History and Physical, chart, labs and discussed the procedure including the risks, benefits and alternatives for the proposed anesthesia with the patient or authorized representative who has indicated his/her understanding and acceptance.     Dental advisory given  Plan Discussed with: CRNA  Anesthesia Plan Comments:         Anesthesia Quick Evaluation

## 2023-06-07 NOTE — Transfer of Care (Signed)
Immediate Anesthesia Transfer of Care Note  Patient: Margaret Levine  Procedure(s) Performed: DIAGNOSTIC LAPAROSCOPY WITH REMOVAL OF ECTOPIC PREGNANCY  Patient Location: PACU  Anesthesia Type:General  Level of Consciousness: awake, alert , and oriented  Airway & Oxygen Therapy: Patient Spontanous Breathing  Post-op Assessment: Report given to RN and Post -op Vital signs reviewed and stable  Post vital signs: Reviewed and stable  Last Vitals:  Vitals Value Taken Time  BP 91/42 06/07/23 2209  Temp 37.2 C 06/07/23 2155  Pulse 113 06/07/23 2212  Resp 14 06/07/23 2212  SpO2 95 % 06/07/23 2212  Vitals shown include unfiled device data.  Last Pain:  Vitals:   06/07/23 2155  TempSrc:   PainSc: 8       Patients Stated Pain Goal: 2 (06/07/23 2155)  Complications: No notable events documented.

## 2023-06-07 NOTE — Op Note (Signed)
Operative Note  PREOPERATIVE DIAGNOSES: 1. Ruptured ectopic pregnancy  POSTOPERATIVE DIAGNOSES: 1. Same, left tubal ectopic  PROCEDURE PERFORMED: Diagnostic laparoscopy, evacuation of hemoperitoneum, left salpingectomy, removal of ectopic pregnancy  SURGEON: Dr. Nilda Simmer ASSISTANT: Dr. Jule Economy  ANESTHESIA: General   ESTIMATED BLOOD LOSS: 400 cc. Of hemoperitoneum  URINE OUTPUT: clear urine at the end of the procedure.  COMPLICATIONS: None   TUBES: None.  DRAINS: None  PATHOLOGY: Left fallopian tube with apparent ectopic pregnancy  FINDINGS: On exam, under anesthesia, normal appearing vulva and vagina, a normal sized uterus, softly distended abdomen  Operative findings demonstrated moderate hemoperitoneum.  Left tube was dilated and expelled clot from fimbria.  The mid portion of the tube with bluish mass consistent with ectopic pregnancy. Many photos were taken.   Procedure: A general anesthesia was induced and the patient was placed in the dirsal lithotomy position. The abdomen, perineum, and vagina were prepped and draped in the usual fashion. A foley catheter inserted into the bladder and attached to straight drainage. After the initial preparation, the procedure commenced at the vagina. With a speculum in place to visualize the cervix, the cervix was grasped and a jacobs tenaculum was placed within the uterine cavity for manipulation purposes being careful not to puncture the uterus. Attention was then turned to the abdomen.   Local anesthetic was infiltrated into prior infraumbilical port site.  A 5 mm trocar was inserted with direct visualization, careful to to injure intraabdominal structure. Once the peritoneum was entered, gas was on for insufflation.  No injuries were noted.   A RLQ 5 mm port was placed with visualization and the process was repeated in the LLQ.  Hemoperitoneum was then evacuated with irrigation.  The above findings were noted, including a  dilated left fallopian tube, clot expelling from the fimbria, and mid portion bluish mass consistent with tubal ectopic pregnancy. The tube appeared dilated and damaged. The tube was then removed with Ligasure along the mesosalpinx without issue. Hemostasis was noted.  Clot and the tube were placed in an endocatch bag.  The bag was entered by up-sizing the infraumbilical port to a 10 mm.  Additional irrigation and removal of clot was done.  Hemostasis again was noted.  The bag was removed with contents intact and sent to pathology for analysis.    The abdomen was then exsufflated and port sites removed. The infraumbilical incision was visualized and fascia closed with vicryl.  The skin was then closed at each port site with Monocryl and skin glue added.  The counts of sponge and instruments were correct x 2 and the case was concluded. The patient was then awakened.  Nilda Simmer MD

## 2023-06-07 NOTE — MAU Provider Note (Signed)
  History     CSN: [id number]  Arrival date and time: 06/07/23 1654   None     Chief Complaint  Patient presents with   Abdominal Pain   Vaginal Bleeding   HPI  27 yo g3p1011 @ 6+2 by LMP presenting with left lower quadrant pain and spotting.  Pain began this morning, is intense. Spotting started a little while ago. Pain is llq and radiates to the back. No dysuria. Nauseous but no vomiting or diarrhea. Seen in clinic today, per patient u/s performed and weren't able to rule out ectopic pregnancy, sent here with concern for that. No prior history ectopic. Does have history c/s x1, appendectomy.  Past Medical History:  Diagnosis Date   Asthma    Endometriosis    Hypothyroidism     Past Surgical History:  Procedure Laterality Date   APPENDECTOMY  2019   CESAREAN SECTION  2019   LAPAROSCOPY  10/2022   LAPAROSCOPY  2019   WISDOM TOOTH EXTRACTION      Family History  Problem Relation Age of Onset   Diabetes Mother    Thyroid disease Father    Thyroid disease Sister    Diabetes Brother    Diabetes Paternal Grandmother    Asthma Son     Social History   Tobacco Use   Smoking status: Never   Smokeless tobacco: Never  Vaping Use   Vaping status: Never Used  Substance Use Topics   Alcohol use: No   Drug use: No    Allergies:  Allergies  Allergen Reactions   Amitriptyline Other (See Comments)    Non-allergic drug reaction, head pain    Medications Prior to Admission  Medication Sig Dispense Refill Last Dose   Albuterol-Budesonide (AIRSUPRA) 90-80 MCG/ACT AERO 2 puffs as needed Inhalation Six times a day for 90 days (Patient not taking: Reported on 06/03/2023)      PULMICORT FLEXHALER 180 MCG/ACT inhaler Inhale one dose once daily to prevent cough or wheeze.  Rinse, gargle, and spit after use. 1 each 5     Review of Systems Physical Exam   Blood pressure 116/64, pulse 89, temperature 98 F (36.7 C), temperature source Oral, resp. rate 20, height 5\' 5"   (1.651 m), weight 62 kg, last menstrual period 04/24/2023, SpO2 98%, currently breastfeeding.  Physical Exam Appears in distress RRR Tender in the LLQ  MAU Course  Procedures  MDM Concern for ectopic pregnancy or other adnexal pathology. Will start an IV, give pain meds, maintain npo, order stat u/s, also labs.  Assessment and Plan   Here left-sided ectopic pregnancy, u/s showing signs of rupture. Hemodynamically stable. Dr. Imogene Burn to admit, plan is laparoscopy.   Margaret Levine 06/07/2023, 5:53 PM

## 2023-06-07 NOTE — Anesthesia Procedure Notes (Addendum)
Procedure Name: Intubation Date/Time: 06/07/2023 8:33 PM  Performed by: Camillia Herter, CRNAPre-anesthesia Checklist: Patient identified, Emergency Drugs available, Suction available and Patient being monitored Patient Re-evaluated:Patient Re-evaluated prior to induction Oxygen Delivery Method: Circle System Utilized Preoxygenation: Pre-oxygenation with 100% oxygen Induction Type: IV induction and Rapid sequence Ventilation: Mask ventilation without difficulty Laryngoscope Size: Miller and 2 Grade View: Grade II Tube type: Oral Tube size: 7.0 mm Number of attempts: 1 Airway Equipment and Method: Stylet and Oral airway Placement Confirmation: ETT inserted through vocal cords under direct vision, positive ETCO2 and breath sounds checked- equal and bilateral Secured at: 21 cm Tube secured with: Tape Dental Injury: Teeth and Oropharynx as per pre-operative assessment

## 2023-06-08 ENCOUNTER — Encounter (HOSPITAL_COMMUNITY): Payer: Self-pay | Admitting: Obstetrics and Gynecology

## 2023-06-08 NOTE — Discharge Summary (Signed)
Gynecology Discharge Summary  Margaret Levine is a 27 y.o. female that presented on 06/07/2023 for abdominal pain in the setting of pregnancy of unknown location.  She has a history of endometriosis.  She was seen in the office for early pregnancy problem of brief left lower quadrant pain that resolved.  Pelvic ultrasound revealed a possible intrauterine gestational sac vs pseudosac.  In the left adnexae was a small lesion concerning for ectopic pregnancy.  She elected for expectant management and was given strict precautions for return of pain.  The pain did return and she presented to MAU. She  also noted onset of vaginal bleeding.  Pelvic US at MAU revealed moderate free fluid and empty uterus as well as persistence of left adnexal finding. She underwent diagnostic laparoscopy, evacuation of hemoperitoneum, left salpingectomy and removal of ectopic pregnancy.  Her procedure was uncomplicated and she was discharged home in stable condition with plans for in-office follow up.   Hemoglobin  Date Value Ref Range Status  06/07/2023 12.0 12.0 - 15.0 g/dL Final   HCT  Date Value Ref Range Status  06/07/2023 36.3 36.0 - 46.0 % Final    Physical Exam:  Gen: alert, well appearing, no distress Chest: nonlabored breathing CV: no peripheral edema Abdomen: soft, nondistended, ATTP, incisions intact Ext: no evidence of DVT  Discharge Diagnoses: Ectopic pregnancy  Discharge Information: Date: 06/08/2023 Activity: Pelvic rest, as tolerated Diet: routine Medications: Tylenol, motrin, oxycodone Condition: stable Instructions: Discussed prior to discharge.  Discharge to: Home  Follow-up Information     [place] Follow up.   Why: The office will reach out to schedule a postoperative visit. Contact information: [address] [place] [address] [phone]                  Lyn Henri 06/08/2023, 8:36 AM

## 2023-06-08 NOTE — Progress Notes (Signed)
Call placed to Dr. Bradley Ferris to inform of patients decreased blood pressure and increased heart rate.Received order for albumin administration. Administered as ordered. Patient tolerated well and vital signs stabilized. Placed follow up call to Dr. Bradley Ferris to inform of vital signs. Dr. Bradley Ferris gave approval for discharge as soon as patient felt ready to go. Patient finished eating crackers and drinking her Margaret Levine and was then able to start getting ready for discharge. Discharge instructions were provided to patient and her significant other, Christian. Both patient and S.O verbalized understanding. Patient tolerated all procedures and dressing for discharge very well. Denied pain , questions or concerns at time of discharge and was very appreciative of care provided.

## 2023-06-09 LAB — SURGICAL PATHOLOGY

## 2023-06-10 ENCOUNTER — Inpatient Hospital Stay (HOSPITAL_COMMUNITY)
Admission: EM | Admit: 2023-06-10 | Discharge: 2023-06-10 | Disposition: A | Discharge status: Home / Self Care | Payer: BC Managed Care – PPO | Attending: Obstetrics & Gynecology | Admitting: Obstetrics & Gynecology

## 2023-06-10 ENCOUNTER — Encounter (HOSPITAL_COMMUNITY): Payer: Self-pay | Admitting: Emergency Medicine

## 2023-06-10 ENCOUNTER — Other Ambulatory Visit: Payer: Self-pay

## 2023-06-10 DIAGNOSIS — R102 Pelvic and perineal pain: Secondary | ICD-10-CM | POA: Diagnosis not present

## 2023-06-10 DIAGNOSIS — Z3A00 Weeks of gestation of pregnancy not specified: Secondary | ICD-10-CM | POA: Diagnosis not present

## 2023-06-10 DIAGNOSIS — O00202 Left ovarian pregnancy without intrauterine pregnancy: Secondary | ICD-10-CM | POA: Insufficient documentation

## 2023-06-10 DIAGNOSIS — O209 Hemorrhage in early pregnancy, unspecified: Secondary | ICD-10-CM

## 2023-06-10 DIAGNOSIS — O046 Delayed or excessive hemorrhage following (induced) termination of pregnancy: Secondary | ICD-10-CM | POA: Diagnosis not present

## 2023-06-10 LAB — CBC
HCT: 35 % — ABNORMAL LOW (ref 36.0–46.0)
Hemoglobin: 11.5 g/dL — ABNORMAL LOW (ref 12.0–15.0)
MCH: 30.9 pg (ref 26.0–34.0)
MCHC: 32.9 g/dL (ref 30.0–36.0)
MCV: 94.1 fL (ref 80.0–100.0)
Platelets: 258 10*3/uL (ref 150–400)
RBC: 3.72 MIL/uL — ABNORMAL LOW (ref 3.87–5.11)
RDW: 12.1 % (ref 11.5–15.5)
WBC: 8 10*3/uL (ref 4.0–10.5)
nRBC: 0 % (ref 0.0–0.2)

## 2023-06-10 LAB — HCG, SERUM, QUALITATIVE: Preg, Serum: POSITIVE — AB

## 2023-06-10 MED ORDER — MISOPROSTOL 200 MCG PO TABS
800.0000 ug | ORAL_TABLET | Freq: Once | ORAL | Status: AC
  Administered 2023-06-10: 800 ug via BUCCAL
  Filled 2023-06-10: qty 4

## 2023-06-10 MED ORDER — KETOROLAC TROMETHAMINE 60 MG/2ML IM SOLN
60.0000 mg | Freq: Once | INTRAMUSCULAR | Status: AC
  Administered 2023-06-10: 60 mg via INTRAMUSCULAR
  Filled 2023-06-10: qty 2

## 2023-06-10 NOTE — MAU Note (Signed)
.  Margaret Levine is a 27 y.o. at [redacted]w[redacted]d here in MAU reporting: here from main ED - was here Monday - ruptured ectopic pregnancy. Everything has been ok - today VB has increased since lunch time. Saturated a pad within 1 hour and passing a lot of large clots. Reports lower dull/aching/sharp abdominal pain that is constant. Reports lower back pain as well - aching. Took 800 mg ibuprofen at 1200  Onset of complaint: 1200 Pain score: 7 - abdomen; 5 -  back Vitals:   06/10/23 1901 06/10/23 1942  BP: 106/77 105/65  Pulse: 75 69  Resp: 18 19  Temp: 97.7 F (36.5 C) 98.1 F (36.7 C)  SpO2: 100% 99%     FHT:NA  Lab orders placed from triage:  UA

## 2023-06-10 NOTE — Anesthesia Postprocedure Evaluation (Signed)
Anesthesia Post Note  Patient: Margaret Levine  Procedure(s) Performed: DIAGNOSTIC LAPAROSCOPY WITH REMOVAL OF ECTOPIC PREGNANCY     Patient location during evaluation: PACU Anesthesia Type: General Level of consciousness: awake Pain management: pain level controlled Vital Signs Assessment: post-procedure vital signs reviewed and stable Respiratory status: spontaneous breathing, nonlabored ventilation and respiratory function stable Cardiovascular status: blood pressure returned to baseline and stable Postop Assessment: no apparent nausea or vomiting Anesthetic complications: no   No notable events documented.  Last Vitals:  Vitals:   06/07/23 2340 06/07/23 2355  BP: 100/63 102/64  Pulse: 92 97  Resp: 16 17  Temp:  36.8 C  SpO2: 97% 96%    Last Pain:  Vitals:   06/07/23 2355  TempSrc:   PainSc: 3                  Demon Volante P Megon Kalina

## 2023-06-10 NOTE — MAU Provider Note (Signed)
Chief Complaint: Vaginal Bleeding   None     SUBJECTIVE HPI: Margaret Levine is a 27 y.o. G3P1021  who is s/p left salpingectomy on 06/07/23 for ectopic pregnancy who presents to maternity admissions reporting onset of heavy vaginal bleeding at home tonight. She soaked through pads, one every 30 minutes with large clots.  Her cramping is moderate, and unchanged since her surgery.    HPI  Past Medical History:  Diagnosis Date   Asthma    Endometriosis    Hypothyroidism    Past Surgical History:  Procedure Laterality Date   APPENDECTOMY  2019   CESAREAN SECTION  2019   DIAGNOSTIC LAPAROSCOPY WITH REMOVAL OF ECTOPIC PREGNANCY N/A 06/07/2023   Procedure: DIAGNOSTIC LAPAROSCOPY WITH REMOVAL OF ECTOPIC PREGNANCY;  Surgeon: Tawni Levy, MD;  Location: [facility] OR;  Service: Gynecology;  Laterality: N/A;   LAPAROSCOPY  10/2022   LAPAROSCOPY  2019   WISDOM TOOTH EXTRACTION     Social History   Socioeconomic History   Marital status: Married    Spouse name: Not on file   Number of children: Not on file   Years of education: Not on file   Highest education level: Not on file  Occupational History   Not on file  Tobacco Use   Smoking status: Never   Smokeless tobacco: Never  Vaping Use   Vaping status: Never Used  Substance and Sexual Activity   Alcohol use: No   Drug use: No   Sexual activity: Yes    Birth control/protection: None  Other Topics Concern   Not on file  Social History Narrative   Not on file   Social Determinants of Health   Financial Resource Strain: Not on file  Food Insecurity: Not on file  Transportation Needs: Not on file  Physical Activity: Not on file  Stress: Not on file  Social Connections: Not on file  Intimate Partner Violence: Not on file   No current facility-administered medications on file prior to encounter.   Current Outpatient Medications on File Prior to Encounter  Medication Sig Dispense Refill   acetaminophen (TYLENOL) 325 MG tablet  Take 2 tablets (650 mg total) by mouth every 6 (six) hours as needed. 30 tablet 0   ibuprofen (MOTRIN IB) 200 MG tablet Take 3 tablets (600 mg total) by mouth every 8 (eight) hours as needed. 30 tablet 0   oxyCODONE (ROXICODONE) 5 MG immediate release tablet Take 1 tablet (5 mg total) by mouth every 4 (four) hours as needed for severe pain (pain score 7-10). 12 tablet 0   Allergies  Allergen Reactions   Amitriptyline Other (See Comments)    Non-allergic drug reaction, head pain    ROS:  Review of Systems  Constitutional:  Negative for chills, fatigue and fever.  Respiratory:  Negative for shortness of breath.   Cardiovascular:  Negative for chest pain.  Gastrointestinal:  Positive for abdominal pain.  Genitourinary:  Positive for vaginal bleeding. Negative for difficulty urinating, dysuria, flank pain, pelvic pain, vaginal discharge and vaginal pain.  Neurological:  Negative for dizziness and headaches.  Psychiatric/Behavioral: Negative.       I have reviewed patient's Past Medical Hx, Surgical Hx, Family Hx, Social Hx, medications and allergies.   Physical Exam  Patient Vitals for the past 24 hrs:  BP Temp Temp src Pulse Resp SpO2 Height Weight  06/10/23 1942 105/65 98.1 F (36.7 C) Oral 69 19 99 % 5\' 5"  (1.651 m) 61.4 kg  06/10/23 1904 -- -- -- -- -- --  5\' 5"  (1.651 m) 62.1 kg  06/10/23 1901 106/77 97.7 F (36.5 C) Oral 75 18 100 % -- --   Constitutional: Well-developed, well-nourished female in no acute distress.  Cardiovascular: normal rate Respiratory: normal effort GI: Abd soft, non-tender. Pos BS x 4 MS: Extremities nontender, no edema, normal ROM Neurologic: Alert and oriented x 4.  GU: Neg CVAT.  PELVIC EXAM: Cervix pink, visually closed, without lesion, scant white creamy discharge, vaginal walls and external genitalia normal Bimanual exam: Cervix 0/long/high, firm, anterior, neg CMT, uterus nontender, nonenlarged, adnexa without tenderness, enlargement, or  mass   LAB RESULTS Results for orders placed or performed during the hospital encounter of 06/10/23 (from the past 24 hour(s))  hCG, serum, qualitative     Status: Abnormal   Collection Time: 06/10/23  7:18 PM  Result Value Ref Range   Preg, Serum POSITIVE (A) NEGATIVE  CBC     Status: Abnormal   Collection Time: 06/10/23  7:18 PM  Result Value Ref Range   WBC 8.0 4.0 - 10.5 K/uL   RBC 3.72 (L) 3.87 - 5.11 MIL/uL   Hemoglobin 11.5 (L) 12.0 - 15.0 g/dL   HCT 96.2 (L) 95.2 - 84.1 %   MCV 94.1 80.0 - 100.0 fL   MCH 30.9 26.0 - 34.0 pg   MCHC 32.9 30.0 - 36.0 g/dL   RDW 32.4 40.1 - 02.7 %   Platelets 258 150 - 400 K/uL   nRBC 0.0 0.0 - 0.2 %    --/--/A POS (11/25 1751)  IMAGING US OB LESS THAN 14 WEEKS WITH OB TRANSVAGINAL  Addendum Date: 06/07/2023   ADDENDUM REPORT: 06/07/2023 18:56 ADDENDUM: These results were called by telephone at the time of interpretation on 06/07/2023 at 6:56 pm to provider [facility] , who verbally acknowledged these results. Electronically Signed   By: Darliss Cheney M.D.   On: 06/07/2023 18:56   Result Date: 06/07/2023 CLINICAL DATA:  Pelvic pain during pregnancy EXAM: OBSTETRIC <14 WK [place] AND TRANSVAGINAL OB US TECHNIQUE: Both transabdominal and transvaginal ultrasound examinations were performed for complete evaluation of the gestation as well as the maternal uterus, adnexal regions, and pelvic cul-de-sac. Transvaginal technique was performed to assess early pregnancy. COMPARISON:  None Available. FINDINGS: Intrauterine gestational sac: None. Endometrium appears normal.  Cervix is closed. Right ovary: Appears normal measuring 2.9 x 2.4 x 2.4 cm. No adnexal mass. Left ovary: The left ovary measures 4.5 x 3.0 by 3.6 cm. There is a heterogeneous mass lateral and adjacent to the left ovary measuring 2.6 x 2.6 x 3.0 cm. Other: There is a moderate amount of free fluid containing echoes throughout the pelvis. IMPRESSION: 1. No intrauterine gestational sac identified.  2. Left adnexal mass measuring 2.6 x 2.6 x 3.0 cm. Moderate amount of complex free fluid in the pelvis, likely hemorrhage. Findings are concerning for left-sided ectopic pregnancy. Electronically Signed: By: Darliss Cheney M.D. On: 06/07/2023 18:53    MAU Management/MDM: Orders Placed This Encounter  Procedures   hCG, serum, qualitative   CBC   Urinalysis, Routine w reflex microscopic -Urine, Clean Catch   Diet NPO time specified   Pelvic cart to bedside   Discharge patient    Meds ordered this encounter  Medications   misoprostol (CYTOTEC) tablet 800 mcg   ketorolac (TORADOL) injection 60 mg    Hgb stable at 11.5.  Consult Dr Despina Hidden with assessment and results.  Likely shedding of endometrium with dropping hcg.  Offered Cytotec and pt agreed to plan.  Cytotec 800 mcg buccal given in MAU with Toradol 60 mg IM.  Rx for Cytotec 400 mcg TID x 3 days sent to pharmacy.  Pad count over 2 hours and bleeding slowed. Pt VS remained stable. D/C home with close follow up as scheduled in office. Return to MAU as needed for emergencies.    ASSESSMENT 1. Vaginal bleeding affecting early pregnancy   2. Ectopic pregnancy of left ovary     PLAN Discharge home Allergies as of 06/10/2023       Reactions   Amitriptyline Other (See Comments)   Non-allergic drug reaction, head pain        Medication List     TAKE these medications    acetaminophen 325 MG tablet Commonly known as: Tylenol Take 2 tablets (650 mg total) by mouth every 6 (six) hours as needed.   ibuprofen 200 MG tablet Commonly known as: Motrin IB Take 3 tablets (600 mg total) by mouth every 8 (eight) hours as needed.   oxyCODONE 5 MG immediate release tablet Commonly known as: Roxicodone Take 1 tablet (5 mg total) by mouth every 4 (four) hours as needed for severe pain (pain score 7-10).        Follow-up Information     [place], [facility] Follow up.   Contact information: [address] [place] [address] [phone]                 Sharen Counter Certified Nurse-Midwife 06/10/2023  9:58 PM

## 2023-06-10 NOTE — ED Triage Notes (Signed)
Pt here for vaginal bleeding, states she was here on Monday for an ectopic pregnancy and had her left tube removed. State that bleeding has been on and off since Monday but heavier today. States she passed 2 large clots today. Reports soaking a pad today within an hour. C/o lower pelvic pain and lower back pain.

## 2023-06-14 DIAGNOSIS — Z4889 Encounter for other specified surgical aftercare: Secondary | ICD-10-CM | POA: Diagnosis not present

## 2023-06-17 ENCOUNTER — Encounter: Payer: Self-pay | Admitting: Allergy and Immunology

## 2023-06-17 NOTE — Telephone Encounter (Signed)
Any update yet on decision?  Please help.

## 2023-06-18 ENCOUNTER — Other Ambulatory Visit (HOSPITAL_COMMUNITY): Payer: Self-pay

## 2023-06-18 NOTE — Telephone Encounter (Signed)
Pharmacy Patient Advocate Encounter  Received notification from [facility] that Prior Authorization for Pulmicort Flexhaler 180MCG/ACT aerosol powder  has been DENIED.  No reason given; No denial letter received via Fax or CMM. It has been requested and will be uploaded to the media tab once received.

## 2023-06-21 ENCOUNTER — Telehealth: Payer: Self-pay

## 2023-06-21 MED ORDER — ASMANEX (60 METERED DOSES) 220 MCG/ACT IN AEPB: INHALATION_SPRAY | RESPIRATORY_TRACT | 5 refills | Status: AC

## 2023-06-21 NOTE — Telephone Encounter (Signed)
-----   Message from ERIC J KOZLOW sent at 06/21/2023  6:54 AM EST ----- Regarding: RE: Pulmicort Asmanex 220 twisthaler - 1 inhalation 1-2 times per day. ----- Message ----- From: Anibal Henderson, CMA Sent: 06/17/2023   5:30 PM EST To: Jessica Priest, MD Subject: Pulmicort                                      Hi Dr. Lucie Leather,  I received a response back from [facility] saying that she is no longer pregnant due to an ectopic pregnancy.  If the PA team is unable to get the Pulmicort approved would you like to try another inhaler?  Thank you, Damita Lack, CMA

## 2023-06-21 NOTE — Addendum Note (Signed)
Addended by: Alphonzo Cruise on: 06/21/2023 09:25 AM   Modules accepted: Orders

## 2023-06-21 NOTE — Telephone Encounter (Signed)
Per Dr. Lucie Leather, use Asmanex 220 Twisthaler- one puff one to two times daily.  Called and informed patient of change in medication.  ERX sent to [facility].

## 2023-06-28 ENCOUNTER — Encounter: Payer: Self-pay | Admitting: Allergy and Immunology

## 2023-06-28 ENCOUNTER — Ambulatory Visit (INDEPENDENT_AMBULATORY_CARE_PROVIDER_SITE_OTHER): Payer: BC Managed Care – PPO | Admitting: Allergy and Immunology

## 2023-06-28 VITALS — BP 118/76 | HR 81 | Resp 16

## 2023-06-28 DIAGNOSIS — J4530 Mild persistent asthma, uncomplicated: Secondary | ICD-10-CM

## 2023-06-28 DIAGNOSIS — J3089 Other allergic rhinitis: Secondary | ICD-10-CM

## 2023-06-28 DIAGNOSIS — K219 Gastro-esophageal reflux disease without esophagitis: Secondary | ICD-10-CM | POA: Diagnosis not present

## 2023-06-28 NOTE — Progress Notes (Signed)
[place] - [place] - [place] - [place] - [place]   Follow-up Note  Referring Provider: Doreene Eland, MD Primary Provider: Doreene Eland, MD Date of Office Visit: 06/28/2023  Subjective:   Margaret Levine (DOB: 1995/08/07) is a 26 y.o. female who [facility] in re-evaluation of the following:  HPI: Margaret Levine returns to this clinic in evaluation of asthma, allergic rhinitis, possible LPR.  I last saw her in this clinic 03 June 2023.  During her last visit she was a few months pregnant and we started her on Pulmicort and Rhinocort and she had an ectopic pregnancy and she required a laparoscopic removal of her fallopian tube end of November.  She still has some cough.  She also has some throat clearing.  She is about the same as when I saw her last time.  She was not able to use any of the inhaled steroids because her insurance blocked acquisition of that product.   Allergies as of 06/28/2023       Reactions   Amitriptyline Other (See Comments)   Non-allergic drug reaction, head pain        Medication List    acetaminophen 325 MG tablet Commonly known as: Tylenol Take 2 tablets (650 mg total) by mouth every 6 (six) hours as needed.   Airsupra 90-80 MCG/ACT Aero Generic drug: Albuterol-Budesonide SMARTSIG:2 Puff(s) Via Inhaler 6 Times Daily PRN   Asmanex (60 Metered Doses) 220 MCG/ACT inhaler Generic drug: mometasone Inhale one puff one to two times daily.  Rinse, gargle, and spit after use.   ibuprofen 200 MG tablet Commonly known as: Motrin IB Take 3 tablets (600 mg total) by mouth every 8 (eight) hours as needed.    Past Medical History:  Diagnosis Date   Asthma    Endometriosis    Hypothyroidism     Past Surgical History:  Procedure Laterality Date   APPENDECTOMY  2019   CESAREAN SECTION  2019   DIAGNOSTIC LAPAROSCOPY WITH REMOVAL OF ECTOPIC PREGNANCY N/A 06/07/2023   Procedure: DIAGNOSTIC  LAPAROSCOPY WITH REMOVAL OF ECTOPIC PREGNANCY;  Surgeon: Tawni Levy, MD;  Location: [facility] OR;  Service: Gynecology;  Laterality: N/A;   LAPAROSCOPY  10/2022   LAPAROSCOPY  2019   WISDOM TOOTH EXTRACTION      Review of systems negative except as noted in HPI / PMHx or noted below:  Review of Systems  Constitutional: Negative.   HENT: Negative.    Eyes: Negative.   Respiratory: Negative.    Cardiovascular: Negative.   Gastrointestinal: Negative.   Genitourinary: Negative.   Musculoskeletal: Negative.   Skin: Negative.   Neurological: Negative.   Endo/Heme/Allergies: Negative.   Psychiatric/Behavioral: Negative.       Objective:   Vitals:   06/28/23 1733  BP: 118/76  Pulse: 81  Resp: 16  SpO2: 98%          Physical Exam Constitutional:      Appearance: She is not diaphoretic.     Comments: Throat clearing  HENT:     Head: Normocephalic.     Right Ear: Tympanic membrane, ear canal and external ear normal.     Left Ear: Tympanic membrane, ear canal and external ear normal.     Nose: Nose normal. No mucosal edema or rhinorrhea.     Mouth/Throat:     Pharynx: Uvula midline. No oropharyngeal exudate.  Eyes:     Conjunctiva/sclera: Conjunctivae normal.  Neck:  Thyroid: No thyromegaly.     Trachea: Trachea normal. No tracheal tenderness or tracheal deviation.  Cardiovascular:     Rate and Rhythm: Normal rate and regular rhythm.     Heart sounds: Normal heart sounds, S1 normal and S2 normal. No murmur heard. Pulmonary:     Effort: No respiratory distress.     Breath sounds: Normal breath sounds. No stridor. No wheezing or rales.  Lymphadenopathy:     Head:     Right side of head: No tonsillar adenopathy.     Left side of head: No tonsillar adenopathy.     Cervical: No cervical adenopathy.  Skin:    Findings: No erythema or rash.     Nails: There is no clubbing.  Neurological:     Mental Status: She is alert.     Diagnostics: Spirometry was performed and  demonstrated an FEV1 of 2.85 at 89 % of predicted.  Assessment and Plan:   1. Not well controlled mild persistent asthma   2. Perennial allergic rhinitis   3. LPRD (laryngopharyngeal reflux disease)    1. Treat and prevent inflammation of airway:   A. Asmanex 220 Twisthaler - 1 inhalation 3-7 times per week (empty lungs)  B. OTC Rhinocort or similar - 1 spray each nostril 1 time per day  2. Treat and prevent reflux induced airway inflammation:   A. Replace throat clearing with swallowing / drinking maneuver  B. Minimize caffeine / chocolate consumption  C. Additional treatment???  3. If needed:   A. Claritin / loratadine 10 mg - 1 tablet 1 time per day  B. Airsupra - 2 inhalations every 6 hours  4. Return to clinic in 12 weeks or earlier if problem  I will have Margaret Levine start inhaled steroids aiming for a dose that results in resolution of her asthmatic symptoms and she can also use a nasal steroid if it is required and she can treat her apparent LPR with some of the techniques noted above and she will keep in contact with me noting her response to this approach in a month or so and she does well we will see her back in this clinic in 12 weeks or earlier if there is a problem.  Margaret Schimke, MD Allergy / Immunology [facility]

## 2023-06-28 NOTE — Patient Instructions (Addendum)
  1. Treat and prevent inflammation of airway:   A. Asmanex 220 Twisthaler - 1 inhalation 3-7 times per week (empty lungs)  B. OTC Rhinocort or similar - 1 spray each nostril 1 time per day  2. Treat and prevent reflux induced airway inflammation:   A. Replace throat clearing with swallowing / drinking maneuver  B. Minimize caffeine / chocolate consumption  C. Additional treatment???  3. If needed:   A. Claritin / loratadine 10 mg - 1 tablet 1 time per day  B. Airsupra - 2 inhalations every 6 hours  4. Return to clinic in 12 weeks or earlier if problem

## 2023-06-29 ENCOUNTER — Encounter: Payer: Self-pay | Admitting: Allergy and Immunology

## 2023-07-02 DIAGNOSIS — E040 Nontoxic diffuse goiter: Secondary | ICD-10-CM | POA: Diagnosis not present

## 2024-01-31 ENCOUNTER — Other Ambulatory Visit: Payer: Self-pay

## 2024-01-31 ENCOUNTER — Emergency Department (HOSPITAL_COMMUNITY)

## 2024-01-31 ENCOUNTER — Encounter (HOSPITAL_COMMUNITY): Payer: Self-pay

## 2024-01-31 ENCOUNTER — Emergency Department (HOSPITAL_COMMUNITY)
Admission: EM | Admit: 2024-01-31 | Discharge: 2024-01-31 | Disposition: A | Discharge status: Home / Self Care | Attending: Emergency Medicine | Admitting: Emergency Medicine

## 2024-01-31 DIAGNOSIS — R1032 Left lower quadrant pain: Secondary | ICD-10-CM | POA: Diagnosis present

## 2024-01-31 DIAGNOSIS — R1030 Lower abdominal pain, unspecified: Secondary | ICD-10-CM

## 2024-01-31 LAB — CBC
HCT: 36.3 % (ref 36.0–46.0)
Hemoglobin: 12.1 g/dL (ref 12.0–15.0)
MCH: 30.7 pg (ref 26.0–34.0)
MCHC: 33.3 g/dL (ref 30.0–36.0)
MCV: 92.1 fL (ref 80.0–100.0)
Platelets: 262 K/uL (ref 150–400)
RBC: 3.94 MIL/uL (ref 3.87–5.11)
RDW: 12 % (ref 11.5–15.5)
WBC: 7.8 K/uL (ref 4.0–10.5)
nRBC: 0 % (ref 0.0–0.2)

## 2024-01-31 LAB — URINALYSIS, ROUTINE W REFLEX MICROSCOPIC
Bilirubin Urine: NEGATIVE
Glucose, UA: NEGATIVE mg/dL
Ketones, ur: NEGATIVE mg/dL
Leukocytes,Ua: NEGATIVE
Nitrite: NEGATIVE
Protein, ur: NEGATIVE mg/dL
Specific Gravity, Urine: 1.017 (ref 1.005–1.030)
pH: 7 (ref 5.0–8.0)

## 2024-01-31 LAB — COMPREHENSIVE METABOLIC PANEL WITH GFR
ALT: 18 U/L (ref 0–44)
AST: 17 U/L (ref 15–41)
Albumin: 4 g/dL (ref 3.5–5.0)
Alkaline Phosphatase: 45 U/L (ref 38–126)
Anion gap: 7 (ref 5–15)
BUN: 10 mg/dL (ref 6–20)
CO2: 23 mmol/L (ref 22–32)
Calcium: 9 mg/dL (ref 8.9–10.3)
Chloride: 107 mmol/L (ref 98–111)
Creatinine, Ser: 0.5 mg/dL (ref 0.44–1.00)
GFR, Estimated: >60 mL/min (ref >60)
Glucose, Bld: 96 mg/dL (ref 70–99)
Potassium: 4 mmol/L (ref 3.5–5.1)
Sodium: 137 mmol/L (ref 135–145)
Total Bilirubin: 0.6 mg/dL (ref 0.0–1.2)
Total Protein: 6.5 g/dL (ref 6.5–8.1)

## 2024-01-31 LAB — LIPASE, BLOOD: Lipase: 27 U/L (ref 11–51)

## 2024-01-31 LAB — HCG, SERUM, QUALITATIVE: Preg, Serum: NEGATIVE

## 2024-01-31 MED ORDER — ONDANSETRON 4 MG PO TBDP
4.0000 mg | ORAL_TABLET | Freq: Once | ORAL | Status: AC | PRN
  Administered 2024-01-31: 4 mg via ORAL
  Filled 2024-01-31: qty 1

## 2024-01-31 MED ORDER — HYDROMORPHONE HCL 1 MG/ML IJ SOLN
0.5000 mg | Freq: Once | INTRAMUSCULAR | Status: AC
  Administered 2024-01-31: 0.5 mg via INTRAVENOUS
  Filled 2024-01-31: qty 1

## 2024-01-31 MED ORDER — OXYCODONE HCL 5 MG PO TABS: 5.0000 mg | ORAL_TABLET | ORAL | 0 refills | Status: DC | PRN

## 2024-01-31 MED ORDER — KETOROLAC TROMETHAMINE 15 MG/ML IJ SOLN
15.0000 mg | Freq: Once | INTRAMUSCULAR | Status: AC
  Administered 2024-01-31: 15 mg via INTRAVENOUS
  Filled 2024-01-31: qty 1

## 2024-01-31 MED ORDER — ONDANSETRON HCL 4 MG/2ML IJ SOLN
4.0000 mg | Freq: Once | INTRAMUSCULAR | Status: AC
  Administered 2024-01-31: 4 mg via INTRAVENOUS
  Filled 2024-01-31: qty 2

## 2024-01-31 NOTE — ED Provider Notes (Signed)
 [place] EMERGENCY DEPARTMENT AT [facility] Provider Note   CSN: [id number] Arrival date & time: 01/31/24  9055     Patient presents with: Abdominal Pain   Margaret Levine is a 28 y.o. female.   28 year old female with a history of ectopic pregnancy status post left salpingectomy who presents to the emergency department with left lower quadrant pain.  Patient reports that this morning she woke up and had sudden onset severe left lower quadrant abdominal pain.  She says it feels like her ovary is about to explode.  Has subsided and pain is currently 4/10 in severity. Had nausea but no vomiting.  Has had some light vaginal bleeding but thinks it is because she is coming up on her menstrual cycle.  No vaginal discharge.  No fevers vomiting, diarrhea.       Prior to Admission medications   Medication Sig Start Date End Date Taking? Authorizing Provider  oxyCODONE  (ROXICODONE ) 5 MG immediate release tablet Take 1 tablet (5 mg total) by mouth every 4 (four) hours as needed for severe pain (pain score 7-10). 01/31/24  Yes Yolande Lamar BROCKS, MD  acetaminophen  (TYLENOL ) 325 MG tablet Take 2 tablets (650 mg total) by mouth every 6 (six) hours as needed. 06/07/23   Glasser, Timothy A, MD  AIRSUPRA 90-80 MCG/ACT AERO SMARTSIG:2 Puff(s) Via Inhaler 6 Times Daily PRN 06/17/23   [provider]  ibuprofen  (MOTRIN  IB) 200 MG tablet Take 3 tablets (600 mg total) by mouth every 8 (eight) hours as needed. 06/07/23   Lequita Evalene LABOR, MD  mometasone (ASMANEX , 60 METERED DOSES,) 220 MCG/ACT inhaler Inhale one puff one to two times daily.  Rinse, gargle, and spit after use. 06/21/23   Kozlow, Camellia PARAS, MD    Allergies: Amitriptyline    Review of Systems  Updated Vital Signs BP 103/67   Pulse (!) 58   Temp 98 F (36.7 C)   Resp 16   Ht 5' 4 (1.626 m)   Wt 63 kg   LMP 12/28/2023 (Exact Date)   SpO2 100%   BMI 23.86 kg/m   Physical Exam Constitutional:      Appearance:  Normal appearance.  Abdominal:     General: Abdomen is flat. There is no distension.     Palpations: Abdomen is soft. There is no mass.     Tenderness: There is abdominal tenderness (Left lower quadrant). There is no guarding.  Neurological:     Mental Status: She is alert.     (all labs ordered are listed, but only abnormal results are displayed) Labs Reviewed  URINALYSIS, ROUTINE W REFLEX MICROSCOPIC - Abnormal; Notable for the following components:      Result Value   APPearance HAZY (*)    Hgb urine dipstick LARGE (*)    Bacteria, UA RARE (*)    All other components within normal limits  LIPASE, BLOOD  COMPREHENSIVE METABOLIC PANEL WITH GFR  CBC  HCG, SERUM, QUALITATIVE    EKG: None  Radiology: US  PELVIC COMPLETE W TRANSVAGINAL AND TORSION R/O Result Date: 01/31/2024 CLINICAL DATA:  Left lower quadrant abdominal pain. EXAM: TRANSABDOMINAL AND TRANSVAGINAL ULTRASOUND OF PELVIS DOPPLER ULTRASOUND OF OVARIES TECHNIQUE: Both transabdominal and transvaginal ultrasound examinations of the pelvis were performed. Transabdominal technique was performed for global imaging of the pelvis including uterus, ovaries, adnexal regions, and pelvic cul-de-sac. It was necessary to proceed with endovaginal exam following the transabdominal exam to visualize the endometrium and ovaries. Color and duplex Doppler ultrasound was utilized  to evaluate blood flow to the ovaries. COMPARISON:  Ultrasound dated 06/07/2023. FINDINGS: Uterus Measurements: 8.9 x 4.5 x 6.1 cm = volume: 128 mL. No fibroids or other mass visualized. Endometrium Thickness: 9 mm.  No focal abnormality visualized. Right ovary Measurements: 3.3 x 1.5 x 2.4 cm = volume: 6.1 mL. A 4 mm echogenic focus may represent a focus of calcification sequela prior hemorrhage or less likely a focus of fat. Left ovary Measurements: 3.5 x 2.1 x 2.3 cm = volume: 8.9 mL. Normal appearance/no adnexal mass. Pulsed Doppler evaluation of both ovaries  demonstrates normal low-resistance arterial and venous waveforms. Other findings No abnormal free fluid. IMPRESSION: Unremarkable pelvic ultrasound. Electronically Signed   By: Vanetta Chou M.D.   On: 01/31/2024 12:28     Procedures   Medications Ordered in the ED  ondansetron  (ZOFRAN -ODT) disintegrating tablet 4 mg (4 mg Oral Given 01/31/24 0955)  HYDROmorphone  (DILAUDID ) injection 0.5 mg (0.5 mg Intravenous Given 01/31/24 1203)  ketorolac  (TORADOL ) 15 MG/ML injection 15 mg (15 mg Intravenous Given 01/31/24 1203)  ondansetron  (ZOFRAN ) injection 4 mg (4 mg Intravenous Given 01/31/24 1202)                                    Medical Decision Making Amount and/or Complexity of Data Reviewed Labs: ordered. Radiology: ordered.  Risk Prescription drug management.   28 year old female with a history of ectopic pregnancy status post left salpingectomy presents emergency department with left lower quadrant pain  Initial Ddx:  Ovarian torsion, ovarian cyst, diverticulitis, cystitis, PID  MDM/Course:  Patient presents the emergency department with left lower quadrant abdominal pain.  Initially told me that it started this morning when she woke up.  When I went in to talk to her again later she saying that this happens every month.  She does not have any GI symptoms aside from nausea.  No fevers.  No vaginal bleeding but no discharge.  Obtained an ultrasound which did not show evidence of torsion or ovarian cyst.  May have had a cyst that ruptured prior to the ultrasound results since she is saying her pain is improved from when it first started.  The fact that this occurs monthly makes me think that is related to her menses.  No other obvious signs of PID at this time.  Also had a salpingectomy making ascending infection of the left side of her pelvis highly unlikely.  Discussed concerns for possible diverticulitis with patient but she feels that it is likely related to her menstrual cycle and I  agree so we will hold off on CT scanning at this time.  However follow-up with her primary doctor and OB/GYN.    This patient presents to the ED for concern of complaints listed in HPI, this involves an extensive number of treatment options, and is a complaint that carries with it a high risk of complications and morbidity. Disposition including potential need for admission considered.   Dispo: DC Home. Return precautions discussed including, but not limited to, those listed in the AVS. Allowed pt time to ask questions which were answered fully prior to dc.  Additional history obtained from mother Records reviewed Outpatient Clinic Notes The following labs were independently interpreted: Chemistry and show no acute abnormality I personally reviewed and interpreted cardiac monitoring: normal sinus rhythm  I have reviewed the patients home medications and made adjustments as needed  Portions of this note were  generated with Scientist, clinical (histocompatibility and immunogenetics). Dictation errors may occur despite best attempts at proofreading.     Final diagnoses:  Lower abdominal pain    ED Discharge Orders          Ordered    oxyCODONE  (ROXICODONE ) 5 MG immediate release tablet  Every 4 hours PRN        01/31/24 1318               Yolande Lamar BROCKS, MD 01/31/24 [phone]

## 2024-01-31 NOTE — Discharge Instructions (Signed)
 You were seen for your abdominal pain in the emergency department.   At home, please take Tylenol  and ibuprofen  for your pain.  You may also take the oxycodone  we have prescribed you for any breakthrough pain that may have.  Do not take this before driving or operating heavy machinery.  Do not take this medication with alcohol.  Check your MyChart online for the results of any tests that had not resulted by the time you left the emergency department.   Follow-up with your primary doctor in 2-3 days regarding your visit.    Return immediately to the emergency department if you experience any of the following: Worsening pain, fever, or any other concerning symptoms.    Thank you for visiting our Emergency Department. It was a pleasure taking care of you today.

## 2024-01-31 NOTE — ED Triage Notes (Signed)
 C/o left lower abd. Pain onset this am. C/o nausea, LMP 06/17, states she had some vag. Bleeding the am. However much lighter than normal period.

## 2024-02-17 ENCOUNTER — Encounter (HOSPITAL_COMMUNITY): Payer: Self-pay

## 2024-02-17 ENCOUNTER — Emergency Department (HOSPITAL_COMMUNITY)

## 2024-02-17 ENCOUNTER — Emergency Department (HOSPITAL_COMMUNITY)
Admission: EM | Admit: 2024-02-17 | Discharge: 2024-02-17 | Disposition: A | Discharge status: Home / Self Care | Attending: Emergency Medicine | Admitting: Emergency Medicine

## 2024-02-17 ENCOUNTER — Other Ambulatory Visit: Payer: Self-pay

## 2024-02-17 DIAGNOSIS — R1032 Left lower quadrant pain: Secondary | ICD-10-CM | POA: Insufficient documentation

## 2024-02-17 DIAGNOSIS — E039 Hypothyroidism, unspecified: Secondary | ICD-10-CM | POA: Insufficient documentation

## 2024-02-17 DIAGNOSIS — J45909 Unspecified asthma, uncomplicated: Secondary | ICD-10-CM | POA: Diagnosis not present

## 2024-02-17 LAB — CBC WITH DIFFERENTIAL/PLATELET
Abs Immature Granulocytes: 0.02 K/uL (ref 0.00–0.07)
Basophils Absolute: 0 K/uL (ref 0.0–0.1)
Basophils Relative: 0 %
Eosinophils Absolute: 0.1 K/uL (ref 0.0–0.5)
Eosinophils Relative: 1 %
HCT: 44.2 % (ref 36.0–46.0)
Hemoglobin: 13.5 g/dL (ref 12.0–15.0)
Immature Granulocytes: 0 %
Lymphocytes Relative: 26 %
Lymphs Abs: 2.1 K/uL (ref 0.7–4.0)
MCH: 30.8 pg (ref 26.0–34.0)
MCHC: 30.5 g/dL (ref 30.0–36.0)
MCV: 100.9 fL — ABNORMAL HIGH (ref 80.0–100.0)
Monocytes Absolute: 0.7 K/uL (ref 0.1–1.0)
Monocytes Relative: 9 %
Neutro Abs: 5.1 K/uL (ref 1.7–7.7)
Neutrophils Relative %: 64 %
Platelets: 179 K/uL (ref 150–400)
RBC: 4.38 MIL/uL (ref 3.87–5.11)
RDW: 11.9 % (ref 11.5–15.5)
WBC: 8 K/uL (ref 4.0–10.5)
nRBC: 0 % (ref 0.0–0.2)

## 2024-02-17 LAB — URINALYSIS, ROUTINE W REFLEX MICROSCOPIC
Bilirubin Urine: NEGATIVE
Glucose, UA: NEGATIVE mg/dL
Hgb urine dipstick: NEGATIVE
Ketones, ur: NEGATIVE mg/dL
Leukocytes,Ua: NEGATIVE
Nitrite: NEGATIVE
Protein, ur: NEGATIVE mg/dL
Specific Gravity, Urine: 1.018 (ref 1.005–1.030)
pH: 5 (ref 5.0–8.0)

## 2024-02-17 LAB — BASIC METABOLIC PANEL WITH GFR
Anion gap: 11 (ref 5–15)
BUN: 9 mg/dL (ref 6–20)
CO2: 20 mmol/L — ABNORMAL LOW (ref 22–32)
Calcium: 9.1 mg/dL (ref 8.9–10.3)
Chloride: 105 mmol/L (ref 98–111)
Creatinine, Ser: 0.76 mg/dL (ref 0.44–1.00)
GFR, Estimated: >60 mL/min (ref >60)
Glucose, Bld: 85 mg/dL (ref 70–99)
Potassium: 3.8 mmol/L (ref 3.5–5.1)
Sodium: 136 mmol/L (ref 135–145)

## 2024-02-17 LAB — PREGNANCY, URINE: Preg Test, Ur: NEGATIVE

## 2024-02-17 MED ORDER — ONDANSETRON HCL 4 MG/2ML IJ SOLN
4.0000 mg | Freq: Once | INTRAMUSCULAR | Status: AC
  Administered 2024-02-17: 4 mg via INTRAVENOUS
  Filled 2024-02-17: qty 2

## 2024-02-17 MED ORDER — KETOROLAC TROMETHAMINE 15 MG/ML IJ SOLN
15.0000 mg | Freq: Once | INTRAMUSCULAR | Status: AC
  Administered 2024-02-17: 15 mg via INTRAVENOUS
  Filled 2024-02-17: qty 1

## 2024-02-17 NOTE — ED Triage Notes (Signed)
 Complains of ovarian pain, nausea, low back pain started this morning. Denies any vaginal bleeding.

## 2024-02-17 NOTE — ED Triage Notes (Signed)
 Pt c/o left ovary pain. C/O nausea/vomiting. Hx of endometriosis. Denies vaginal bleeding.

## 2024-02-17 NOTE — ED Notes (Signed)
 Pt back from US .  Reports some continued mild nausea. Medicated with zofran  per MAR.  Family at bedside. Denies further needs.

## 2024-02-17 NOTE — Discharge Instructions (Signed)
 Your evaluation in the emergency department has been reassuring.  We recommend follow-up with your OB/GYN for ongoing management of your pain and history of endometriosis.  Return to the ED for new or concerning symptoms.

## 2024-02-17 NOTE — ED Provider Notes (Signed)
 [place] EMERGENCY DEPARTMENT AT [facility] Provider Note   CSN: [id number] Arrival date & time: 02/17/24  1458     Patient presents with: Ovarian pain    Margaret Levine is a 28 y.o. female.   28 year old female with a history of hypothyroid, asthma, endometriosis presents to the emergency department for evaluation of left lower quadrant abdominal pain.  She suspects that her pain is related to her left ovary as she has felt similar discomfort in the past.  She did not have any of her medications with her to take.  Pain began this morning and has remained constant.  She has had some nausea without associated vomiting.  Denies fever, urinary symptoms, vaginal complaints, diarrhea, melena or hematochezia. LMP 01/31/24.  Abdominal surgical history notable for appendectomy, cesarean section, and multiple laparoscopies for endometriosis as well as left salpingectomy 2/2 ruptured ectopic pregnancy.  The history is provided by the patient and a parent. No language interpreter was used.       Prior to Admission medications   Medication Sig Start Date End Date Taking? Authorizing Provider  acetaminophen  (TYLENOL ) 325 MG tablet Take 2 tablets (650 mg total) by mouth every 6 (six) hours as needed. 06/07/23   Glasser, Timothy A, MD  AIRSUPRA 90-80 MCG/ACT AERO SMARTSIG:2 Puff(s) Via Inhaler 6 Times Daily PRN 06/17/23   [provider]  ibuprofen  (MOTRIN  IB) 200 MG tablet Take 3 tablets (600 mg total) by mouth every 8 (eight) hours as needed. 06/07/23   Lequita Evalene LABOR, MD  mometasone (ASMANEX , 60 METERED DOSES,) 220 MCG/ACT inhaler Inhale one puff one to two times daily.  Rinse, gargle, and spit after use. 06/21/23   Kozlow, Camellia PARAS, MD  oxyCODONE  (ROXICODONE ) 5 MG immediate release tablet Take 1 tablet (5 mg total) by mouth every 4 (four) hours as needed for severe pain (pain score 7-10). 01/31/24   Yolande Lamar BROCKS, MD    Allergies: Amitriptyline    Review of Systems Ten  systems reviewed and are negative for acute change, except as noted in the HPI.    Updated Vital Signs BP (!) 116/92 (BP Location: Right Arm)   Pulse 85   Temp 98.1 F (36.7 C) (Oral)   Resp 15   Ht 5' 3 (1.6 m)   Wt 63.5 kg   LMP 01/31/2024 (Exact Date)   SpO2 100%   Breastfeeding No   BMI 24.80 kg/m   Physical Exam Vitals and nursing note reviewed.  Constitutional:      General: She is not in acute distress.    Appearance: She is well-developed. She is not diaphoretic.     Comments: Nontoxic appearing and in NAD  HENT:     Head: Normocephalic and atraumatic.  Eyes:     General: No scleral icterus.    Conjunctiva/sclera: Conjunctivae normal.  Cardiovascular:     Rate and Rhythm: Normal rate and regular rhythm.     Pulses: Normal pulses.  Pulmonary:     Effort: Pulmonary effort is normal. No respiratory distress.     Breath sounds: No stridor.     Comments: Respirations even and unlabored Abdominal:     General: There is no distension.     Palpations: Abdomen is soft.  Musculoskeletal:        General: Normal range of motion.     Cervical back: Normal range of motion.  Skin:    General: Skin is warm and dry.     Coloration: Skin is not pale.  Findings: No erythema or rash.  Neurological:     Mental Status: She is alert and oriented to person, place, and time.     Coordination: Coordination normal.  Psychiatric:        Behavior: Behavior normal.     (all labs ordered are listed, but only abnormal results are displayed) Labs Reviewed  URINALYSIS, ROUTINE W REFLEX MICROSCOPIC - Abnormal; Notable for the following components:      Result Value   APPearance CLOUDY (*)    All other components within normal limits  CBC WITH DIFFERENTIAL/PLATELET - Abnormal; Notable for the following components:   MCV 100.9 (*)    All other components within normal limits  BASIC METABOLIC PANEL WITH GFR - Abnormal; Notable for the following components:   CO2 20 (*)    All  other components within normal limits  PREGNANCY, URINE    EKG: None  Radiology: US  PELVIC COMPLETE W TRANSVAGINAL AND TORSION R/O Result Date: 02/17/2024 CLINICAL DATA:  Left lower quadrant pain EXAM: TRANSABDOMINAL AND TRANSVAGINAL ULTRASOUND OF PELVIS DOPPLER ULTRASOUND OF OVARIES TECHNIQUE: Both transabdominal and transvaginal ultrasound examinations of the pelvis were performed. Transabdominal technique was performed for global imaging of the pelvis including uterus, ovaries, adnexal regions, and pelvic cul-de-sac. It was necessary to proceed with endovaginal exam following the transabdominal exam to visualize the uterus endometrium adnexa. Color and duplex Doppler ultrasound was utilized to evaluate blood flow to the ovaries. COMPARISON:  Ultrasound 01/31/2024 FINDINGS: Uterus Measurements: 9.5 by 3.9 x 6.6 cm = volume: 128.8 mL. No fibroids or other mass visualized. Endometrium Thickness: 12.4 mm.  No focal abnormality visualized. Right ovary Measurements: 4.1 x 1.7 x 2.9 cm = volume: 10.6 mL. Normal appearance/no adnexal mass. Left ovary Measurements: 3.8 x 2.4 x 2.6 cm = volume: 12.2 mL. Normal appearance/no adnexal mass. Pulsed Doppler evaluation of both ovaries demonstrates normal low-resistance arterial and venous waveforms. Other findings No abnormal free fluid. IMPRESSION: Negative pelvic ultrasound. Electronically Signed   By: Luke Bun M.D.   On: 02/17/2024 19:05     Procedures   Medications Ordered in the ED  ketorolac  (TORADOL ) 15 MG/ML injection 15 mg (15 mg Intravenous Given 02/17/24 1731)  ondansetron  (ZOFRAN ) injection 4 mg (4 mg Intravenous Given 02/17/24 1845)    Clinical Course as of 02/17/24 2246  Thu Feb 17, 2024  1806 Patient requesting medication for nausea. [KH]  1922 Normal pelvic US . No evidence of ovarian torsion. [KH]    Clinical Course User Index [KH] Keith Sor, PA-C                                 Medical Decision Making Amount and/or Complexity of  Data Reviewed Labs: ordered. Radiology: ordered.  Risk Prescription drug management.   This patient presents to the ED for concern of LLQ pain, this involves an extensive number of treatment options, and is a complaint that carries with it a high risk of complications and morbidity.  The differential diagnosis includes ovarian cyst vs ovarian torsion vs TOA vs diverticulitis vs kidney stone vs mass.   Co morbidities that complicate the patient evaluation  Endometriosis    Additional history obtained:  Additional history obtained from family, at bedside External records from outside source obtained and reviewed including pelvic US  in July 2025 which was negative   Lab Tests:  I Ordered, and personally interpreted labs.  The pertinent results include:  CO2 20. Otherwise  normal labs.   Imaging Studies ordered:  I ordered imaging studies including Pelvis US   I independently visualized and interpreted imaging which showed no acute pathology I agree with the radiologist interpretation   Cardiac Monitoring:  The patient was maintained on a cardiac monitor.  I personally viewed and interpreted the cardiac monitored which showed an underlying rhythm of: NSR   Medicines ordered and prescription drug management:  I ordered medication including Toradol  for pain and Zofran  for nausea  Reevaluation of the patient after these medicines showed that the patient improved I have reviewed the patients home medicines and have made adjustments as needed   Test Considered:  CT abd/pelvis - felt low yield given reassuring labs, chronicity and recurrence of symptoms   Problem List / ED Course:  28 year old female presenting for left lower quadrant abdominal pain.  She has had similar pain in the past associated with her endometriosis.  Was told that she may have residual scar tissue from prior left salpingectomy which could provoke an episode of ovarian torsion.  This is her largest  concern today. Laboratory evaluation is noncontributory and stable with prior workups.  Urinalysis without concern for UTI.  Pregnancy is negative. A repeat pelvic ultrasound was performed which shows no evidence of ovarian torsion, TOA, ovarian cyst.  Patient is feeling symptomatically improved following IV Toradol .  Have advised that she continue follow-up with her OB/GYN.   Reevaluation:  After the interventions noted above, I reevaluated the patient and found that they have :remained stable   Social Determinants of Health:  Good social support   Dispostion:  After consideration of the diagnostic results and the patients response to treatment, I feel that the patent would benefit from ongoing outpatient evaluation. Return precautions discussed and provided. Patient discharged in stable condition with no unaddressed concerns.       Final diagnoses:  Left lower quadrant abdominal pain    ED Discharge Orders          Ordered    Ambulatory referral to Gynecology        02/17/24 2039               Keith Sor, DEVONNA 02/17/24 2251    Cottie Donnice PARAS, MD 02/17/24 [phone]

## 2024-02-17 NOTE — ED Notes (Signed)
 PA at bedside chatting with patient.

## 2024-04-24 ENCOUNTER — Encounter: Payer: Self-pay | Admitting: Allergy and Immunology

## 2024-04-24 ENCOUNTER — Ambulatory Visit: Admitting: Allergy and Immunology

## 2024-04-24 VITALS — BP 118/92 | HR 80 | Resp 18 | Ht 64.0 in | Wt 141.6 lb

## 2024-04-24 DIAGNOSIS — J3089 Other allergic rhinitis: Secondary | ICD-10-CM

## 2024-04-24 DIAGNOSIS — K219 Gastro-esophageal reflux disease without esophagitis: Secondary | ICD-10-CM | POA: Diagnosis not present

## 2024-04-24 DIAGNOSIS — J4541 Moderate persistent asthma with (acute) exacerbation: Secondary | ICD-10-CM | POA: Diagnosis not present

## 2024-04-24 DIAGNOSIS — B9789 Other viral agents as the cause of diseases classified elsewhere: Secondary | ICD-10-CM

## 2024-04-24 DIAGNOSIS — J988 Other specified respiratory disorders: Secondary | ICD-10-CM

## 2024-04-24 MED ORDER — AIRSUPRA 90-80 MCG/ACT IN AERO: 2.0000 | INHALATION_SPRAY | RESPIRATORY_TRACT | 1 refills | Status: AC | PRN

## 2024-04-24 NOTE — Progress Notes (Unsigned)
 [place] - [place] - [place] - [place] - [place]   Follow-up Note  Referring Provider: [facility] * Primary Provider: [facility], [facility] Date of Office Visit: 04/24/2024  Subjective:   Margaret Levine (DOB: 08-Apr-1996) is a 28 y.o. female who [facility] in re-evaluation of the following:  HPI: Ceonna returns to this clinic in evaluation of asthma, allergic rhinitis, LPR.  I last saw her in this clinic 28 June 2023.  She has really done very well since her last visit and has not required any systemic steroids or antibiotics to treat an exacerbation and stopped using all of her controller agents or rescue medications and overall had excellent control of all of her issues.  However, 2 weeks ago she contracted a viral respiratory tract infection with a lot of head stuffiness and nasal congestion along with cough.  Although her upper airway issue has resolved after receiving Augmentin and prednisone from the urgent care center which she finished 5 days ago she still continues to have lots of cough and she just feels pretty raw in her airway.  She does not have any shortness of breath or chest tightness or sputum production or chest pain and as mentioned above she has no significant upper airway symptoms.  She does have a little bit of throat clearing but no obvious reflux symptoms.  She grabbed a Breztri as her rescue medicine over the course of the past 3 days.  Allergies as of 04/24/2024       Reactions   Amitriptyline Other (See Comments)   Non-allergic drug reaction, head pain        Medication List    acetaminophen  325 MG tablet Commonly known as: Tylenol  Take 2 tablets (650 mg total) by mouth every 6 (six) hours as needed.   Airsupra 90-80 MCG/ACT Aero Generic drug: Albuterol-Budesonide  SMARTSIG:2 Puff(s) Via Inhaler 6 Times Daily PRN   Asmanex  (60 Metered Doses) 220 MCG/ACT  inhaler Generic drug: mometasone Inhale one puff one to two times daily.  Rinse, gargle, and spit after use.   Breztri Aerosphere 160-9-4.8 MCG/ACT Aero inhaler Generic drug: budesonide -glycopyrrolate -formoterol Inhale 2 puffs into the lungs 2 (two) times daily.   ibuprofen  200 MG tablet Commonly known as: Motrin  IB Take 3 tablets (600 mg total) by mouth every 8 (eight) hours as needed.    Past Medical History:  Diagnosis Date   Asthma    Endometriosis    Hypothyroidism     Past Surgical History:  Procedure Laterality Date   APPENDECTOMY  2019   CESAREAN SECTION  2019   DIAGNOSTIC LAPAROSCOPY WITH REMOVAL OF ECTOPIC PREGNANCY N/A 06/07/2023   Procedure: DIAGNOSTIC LAPAROSCOPY WITH REMOVAL OF ECTOPIC PREGNANCY;  Surgeon: Laurence Slater PARAS, MD;  Location: [facility] OR;  Service: Gynecology;  Laterality: N/A;   LAPAROSCOPY  10/2022   LAPAROSCOPY  2019   WISDOM TOOTH EXTRACTION      Review of systems negative except as noted in HPI / PMHx or noted below:  Review of Systems  Constitutional: Negative.   HENT: Negative.    Eyes: Negative.   Respiratory: Negative.    Cardiovascular: Negative.   Gastrointestinal: Negative.   Genitourinary: Negative.   Musculoskeletal: Negative.   Skin: Negative.   Neurological: Negative.   Endo/Heme/Allergies: Negative.   Psychiatric/Behavioral: Negative.       Objective:   Vitals:   04/24/24 1109  BP: (!) 118/92  Pulse: 80  Resp: 18  SpO2: 98%  Height: 5' 4 (162.6 cm)  Weight: 141 lb 9.6 oz (64.2 kg)   Physical Exam Constitutional:      Appearance: She is not diaphoretic.  HENT:     Head: Normocephalic.     Right Ear: Tympanic membrane, ear canal and external ear normal.     Left Ear: Tympanic membrane, ear canal and external ear normal.     Nose: Nose normal. No mucosal edema or rhinorrhea.     Mouth/Throat:     Pharynx: Uvula midline. No oropharyngeal exudate.  Eyes:     Conjunctiva/sclera: Conjunctivae normal.  Neck:      Thyroid : No thyromegaly.     Trachea: Trachea normal. No tracheal tenderness or tracheal deviation.  Cardiovascular:     Rate and Rhythm: Normal rate and regular rhythm.     Heart sounds: Normal heart sounds, S1 normal and S2 normal. No murmur heard. Pulmonary:     Effort: No respiratory distress.     Breath sounds: Normal breath sounds. No stridor. No wheezing or rales.  Lymphadenopathy:     Head:     Right side of head: No tonsillar adenopathy.     Left side of head: No tonsillar adenopathy.     Cervical: No cervical adenopathy.  Skin:    Findings: No erythema or rash.     Nails: There is no clubbing.  Neurological:     Mental Status: She is alert.     Diagnostics: Spirometry was performed and demonstrated an FEV1 of 2.89 at 89 % of predicted.   Assessment and Plan:   1. Asthma, not well controlled, moderate persistent, with acute exacerbation   2. Perennial allergic rhinitis   3. LPRD (laryngopharyngeal reflux disease)   4. Viral respiratory infection    1. For this recent event:   A. Breztri - 2 inhalations 2 times per day w/ spacer (empty lungs)  B. OTC mucinex DM - 2 times per day  C. OTC Loratadine 10 mg - 2 times per day  D. Airsupra - 2 inhalations every 4-6 hours if needed:  2. Treat and prevent reflux induced airway inflammation:   A. Replace throat clearing with swallowing / drinking maneuver  B. Minimize caffeine / chocolate consumption  3. If needed:   A. Claritin / loratadine 10 mg - 1 tablet 1 time per day  B. Airsupra - 2 inhalations every 6 hours  C. OTC Rhinocort or similar - 1 spray each nostril 1 time per day  4. Return to clinic in 12 months or earlier if problem  5. Influenza = Tamiflu. Covid = Paxlovid  Yakira has had a viral respiratory tract infection that is giving rise to inflammation of her airway and she will utilize the plan noted above to address this issue.  I suspect that she will do well over the course of the next week and if she  has prolonged respiratory tract symptoms she will contact me for further evaluation and treatment.  Overall she is really doing very well since I have last seen her in this clinic utilizing medications mostly on an as-needed basis and she can continue with this intermittent use of anti-inflammatory agents for her airway as noted above and we will certainly be available should she develop problems in the face of this plan.  We will see her back in this clinic in 12 months or earlier if there is a problem.  Camellia Denis, MD Allergy / Immunology [facility]

## 2024-04-24 NOTE — Patient Instructions (Addendum)
  1. For this recent event:   A. Breztri - 2 inhalations 2 times per day w/ spacer (empty lungs)  B. OTC mucinex DM - 2 times per day  C. OTC Loratadine 10 mg - 2 times per day  D. Airsupra - 2 inhalations every 4-6 hours if needed:  2. Treat and prevent reflux induced airway inflammation:   A. Replace throat clearing with swallowing / drinking maneuver  B. Minimize caffeine / chocolate consumption  3. If needed:   A. Claritin / loratadine 10 mg - 1 tablet 1 time per day  B. Airsupra - 2 inhalations every 6 hours  C. OTC Rhinocort or similar - 1 spray each nostril 1 time per day  4. Return to clinic in 12 months or earlier if problem  5. Influenza = Tamiflu. Covid = Paxlovid

## 2024-04-25 ENCOUNTER — Encounter: Payer: Self-pay | Admitting: Allergy and Immunology

## 2024-06-29 NOTE — Progress Notes (Signed)
 "  Provider: Corean Kanner, FNP-C  Division: Minimally Invasive Gynecologic Surgery  Date: June 30, 2024 Patient Name: Margaret Levine MRN: [id number] PCP: Margaret Levine III Referring Provider: Debby Laveda Levine DOUGLAS, MD   Assessment and Plan:    28 y.o. female [phone] with clinical history and exam findings consistent with:    ICD-10-CM  1. Endometriosis determined by laparoscopy  N80.9  2. Myalgia of pelvic floor  M79.18     No orders of the defined types were placed in this encounter.   Overall, [name]'s pelvic pain is significantly improved s/p excision of endometriosis and PFPT. Her daily pain is manageable, her dyspareunia is reduced. However she still has very significant dysmenorrhea.  She has been loosely trying to conceive for about a year-- she was tracking for 3-6 months, now not trying/not preventing.  They really want another child, although she is anxious about having another ectopic. Her primary OBGYN recommended IVF.  Dysmenorrhea, Stage I Endometriosis s/p EoE with care 10/2022 - Discussed given her pain is mostly cyclic, would likely have the most improvement with hormonal suppression.  She has failed methods in the past, but only tried norethindrone briefly and is open to trying again. Unfortunately., she is also TTC, so is less interested in hormones at this time. - Not interested in hysterectomy, do not recommend an additional EoE at this time - Has minimal improvement with cyclic NSAID, robaxin, or gabapentin  - Bloating is one of her primary symptoms - discussed diet that limits processed foods, FODMAP - Will return to discuss supplements with Levine  Subfertility  - Has not conceived despite about a year of unprotected intercourse - S/p L ectopic and salpingectomy; right tube previously open on chromopertubation., Briefly discussed impact of endo on fertility.  - Reviewed recommend discussing with primary OBGYN.  When not actively trying  to conceive/at the completeion of childbearing, more options available for treatment of end  Treatment goals: Treatment goals include complete resolution of pain with medication if necessary.   Follow Up: Return in about 2 months (around 08/31/2024).     I personally spent 38 minutes face-to-face and non-face-to-face in the care of this patient, which includes all pre, intra, and post visit time on the date of service.  All documented time was specific to the E/M visit and does not include any procedures that may have been performed.    Subjective:   History of Present Illness:  Ms. Desrocher is a 28 y.o. female [phone] seen in consultation at the request for evaluation and recommendations regarding Her endometriosis.   Last seen 07/2023.  At that time, plan for gabapentin and LDN.  LDN was too expensive.  Stopped going to PT about a year ago-- her therapist moved.   Still has pain with sex.  Improved. Not trying/not preventing. Wants another child/  Tried OPK for3-6 months.   Pain is worse during her periods, but has flares outside of her period.  Less frequent/severe than before surgery and PFPT.   Current Treatments Previous Treatments  Current Relevant Medications: Ibuprofen   Acetaminophen   Gabapentin PRN - doesn't really help Methocarbamol PRN   Current Physical Therapy:  None  Adjunct Treatments:  Activity Modification and Heat/Ice  In a Pain Clinic:  No, has seen a pain management specialist  Prior Relevant Medications: Orlissa 150 mg daily - this initially helped with symptoms but then stopped working  Orlissa 200 mg BID - significant side effects Aygestin OCPs - emotional Oxycodone  - did not  really help Toradol  - not effective  Ibuprofen   Acetaminophen   Levothyroxine ( this was discontinued as thyroid  normalized ) Valium PRN   Prior Physical Therapy: Previously, 2025  Injections: None Was recommended L spine injections for sciatic pain with menses   Prior  Relevant Surgeries:  Diagnostic Lap ([facility], 2019) - fulgeration lesions. Helped for 1 month  10/2022 - EoE w/ Lorene - helped for a month  05/2023 - LEFT Salpingectomy, ectopic    12/25/2022:  S/p LSC EoE 4/16 with Lorene. Determined to have stage I endometriosis. Now reporting blood in stool that  has happened intermittently since her surgery. This also happened occasionally prior to surgery. Blood will be present on the toilet paper, sometimes in small volumes in the toilet. There has been no significant change in the caliber or frequency of her stool. She has an upcoming appointment with GI in July. She is curious if it is hemorrhoids but wanted to check in with us  to make sure it is not endometriosis.   03/30/2022: Previously seen by Jolie. Tried valium and robaxin during periods.  Helped take the edge off. Has not done any other interventions since last visit-- unable to access HEP, did not know taking Aygestin was the plan. Has not been sexually active.   History review: History of really bad periods. Had been OCPs with mininimal improvements.  Had baby, then had dx lsc w/ fulgeration which helped with pain for a month or so.   Had a trial of Orilissa -- helped a little bit for a couple months. Increased dose, only minimal improvement and significant side effects.  Offered lupron, declined.   Interested in surgical intervention.  Initial history  Date of onset: age 3, menarche - started OCPs age 48 for pain - in 2018 stopped taking birth control, 3 months after found out she was pregnant.   Location: pelvis   Intensity:      06/30/24 1338  PainSc: 4      Frequency: Constant Modifying factors:  intercourse, bowel movement, standing, riding in a car, stress, contact with clothing, urination, period, and full bladder Associated symptoms: burning, nauseating, pulling, pulsing, sharp, shooting, stabbing, tender, and throbbing  Reports irregular menses have always been  irregular. Usually last 9-10 days. Moderate to heavy bleeding. Occasionally dizziness associated. This has not happened recently.   Pain has become more constant. Always worse with menses.   Daily Pain:  - RLQ - Co morbid L spine pain   Menstrual Pain: - Nauseated with have vomiting (has zofran  PRN) - prevents her from doing things, will have to call out of work  - at times feel like she can't even be a mom  - Has radicular pain on posterior BLLEs with menses, this does not extend past her knees. These radicular symptoms will also be present in her rectum.  - Severe pain with bowel movements.   Always feels pelvic pressure and heaviness. Bloating.  Does feel like she has urinary retention. History of frequent UTIs. Histoyr of constipation, occasionally will go only once a week.   Goal of future fertility.   Recalls that when she had her appendectomy there was no evidence of appendicitis but instead there was a large cyst on her ovary (not sure if this was an endometrioma) '  Interested to know if she is a candidate for repeat Lap/EoE.   Has never tried muscle relaxers for pain.    Pelvic ROS Bowels: Constipation No melena, hematochezia or dyschezia.  Bladder: Loss  of Urine w. Coughing, Sneeing, Laughing and Frequent UTIs Sexual intercourse: Pain with deep penetration. No improvement with position changes. Sometimes will have pain with superficial light touch or initial penetration. Reports that feels like the lubricates well. Does have a normal sexual response cycle.  Sleep: Denies difficulty falling asleep, Denies difficulty staying asleep, and Hours of sleep per night 6-8 Mood/Safety: Does feel safe in current living situation  Current Exercise Regimen: Walking  PAST GYNECOLOGIC HISTORY:   Menarche: 13 Menses: Occur every 14-28 days with 9-10 days of heavy flow. Severe dysmenorrhea.  See Below   Imaging/Labs:  None available to review this visit    Obstetric and  Gynecologic History History OB History  Gravida Para Term Preterm AB Living  3 1 1  2 1   SAB IAB Ectopic Molar Multiple Live Births  1  1   1     # Outcome Date GA Lbr Len/2nd Weight Sex Type Anes PTL Lv  3 Ectopic 06/07/23 [redacted]w[redacted]d   U      2 Term 10/04/17 [redacted]w[redacted]d  3544 g (7 lb 13 oz) M CS-LTranv EPI N LIV     Birth Comments: Breast     Complications: Failure to Progress in First Stage  1 SAB             Obstetric Comments  Menarche at age: 24                                                                                Menopause at age:                                                                              Last PAP: within last year                        Hx of Abnormal PAP: n                                                                                                                       Medical History  She  has a past medical history of Disease of thyroid  gland and Endometriosis.   Surgical History  She  has a past surgical history that includes Cesarean section; Appendectomy; Diagnostic laparoscopy; pr lap,fulgurate/excise lesions (N/A, 10/27/2022); pr reopen fallopian tube,chromotubation (Bilateral, 10/27/2022); and Salpingectomy (Left).   Allergies  Amitriptyline  Medications  Current Outpatient Medications:    gabapentin (NEURONTIN) 300 MG capsule, Take 1 capsule (300 mg total) by mouth nightly as needed., Disp: 30 capsule, Rfl: 0   methocarbamoL (ROBAXIN) 500 MG tablet, Take 1 tablet (500 mg total) by mouth Three (3) times a day as needed (take for pain with menses) for up to 90 doses., Disp: 30 tablet, Rfl: 2   ondansetron  (ZOFRAN -ODT) 4 MG disintegrating tablet, Take 1 tablet (4 mg total) by mouth every eight (8) hours as needed., Disp: , Rfl:    cholecalciferol, vitamin D3-125 mcg, 5,000 unit,, 125 mcg (5,000 unit) capsule, TAKE 1 CAPSULE BY MOUTH ON MONDAY, WEDNESDAY AND FRIDAY (Patient not taking: Reported on 06/30/2024), Disp: , Rfl:    clobetasoL  (TEMOVATE) 0.05 % external solution, apply TWICE DAILY for 2-3 weeks AS NEEDED on damp scalp (Patient not taking: Reported on 06/30/2024), Disp: , Rfl:    gabapentin (NEURONTIN) 100 MG capsule, Take 1 capsule (100 mg total) by mouth two (2) times a day. (Patient not taking: Reported on 06/30/2024), Disp: 60 capsule, Rfl: 1   ketoconazole (NIZORAL) 2 % shampoo, apply 3 times a week AS NEEDED (Patient not taking: Reported on 06/30/2024), Disp: , Rfl:    NON FORMULARY, Low Dose Naltrexone 1.5 mg capsules.  Take 1.5 mg (1 capsule) at nightly for 7 days. THEN Take 3 mg (2 capsules) at nightly for 7 days. (Patient not taking: Reported on 06/30/2024), Disp: 21 each, Rfl: 0   NON FORMULARY, Low Dose Naltrexone 4.5 mg capsules.  Take 4.5 mg (1 capsule) daily once you have completed ramp up.   Please message [facility] for refill with one week left in your prescription. (Patient not taking: Reported on 06/30/2024), Disp: 30 each, Rfl: 1     Family History Her family history includes Cervical cancer in her mother.   Social History She  reports that she has never smoked. She has never used smokeless tobacco. She reports that she does not drink alcohol and does not use drugs.     Occupational History   Not on file       Review of Systems: 10 point ROS completed, negative other than noted in HPI.  Objective:  The primary encounter diagnosis was Endometriosis determined by laparoscopy. A diagnosis of Myalgia of pelvic floor was also pertinent to this visit.   PHYSICAL EXAM:  BP 117/73 (BP Position: Sitting)   Pulse 79   LMP 06/03/2024 (Exact Date)       06/30/24 1338  PainSc: 4       Constitutional: Well-developed, well-nourished female in no acute distress Neuro: Alert and oriented to person, place, and time. Cardiovascular: HR WNL, no cyanosis, no evidence of edema. Pulmonary: No work of breathing.  Psychiatric: Mood and affect appropriate. Skin: No rashes or lesions. No  lymphadenopathy.    Exam deferred today, copied forward for reference.  Gastrointestinal: Soft, nontender, nondistended. No masses or hernias appreciated, no hepatosplenomegaly. No fluid wave. No rebound or guarding.  Genitourinary:     Vulva: Clitoris, prepuce, labia majora, labia minora, mons pubis normal.  On examination with cotton tipped applicator allodynia present at 10 and 2 o clock, none at the posterior fourchette.      Urethra: Midline, no masses, nontender.    Bladder: Well-suspended, nontender.   Vagina:  Normally rugated, no lesions.    Cervix: No lesions, normal size and consistency; no cervical motion tenderness   Uterus: 6 week size and contour; smooth, mobile, tender, anteroverted Adnexa/Parametria: no  masses; no parametrial nodularity Perineum/Anus: no lesions Rectal: deferred  Musculoskeletal:     Muscle Right Left Reproduces pain?  Levator ++ ++ Yes, with intercourse  Obturator ++ ++ Yes, with intercourse  Piriformis NT NT NT      "

## 2024-07-14 ENCOUNTER — Other Ambulatory Visit: Payer: Self-pay

## 2024-07-14 ENCOUNTER — Ambulatory Visit: Admitting: Obstetrics and Gynecology

## 2024-07-14 ENCOUNTER — Encounter: Admitting: Advanced Practice Midwife

## 2024-07-14 ENCOUNTER — Encounter: Payer: Self-pay | Admitting: Obstetrics and Gynecology

## 2024-07-14 VITALS — BP 121/76 | HR 68 | Wt 143.2 lb

## 2024-07-14 DIAGNOSIS — R102 Pelvic and perineal pain unspecified side: Secondary | ICD-10-CM | POA: Diagnosis not present

## 2024-07-14 DIAGNOSIS — N809 Endometriosis, unspecified: Secondary | ICD-10-CM

## 2024-07-14 DIAGNOSIS — Z3169 Encounter for other general counseling and advice on procreation: Secondary | ICD-10-CM

## 2024-07-14 DIAGNOSIS — G8929 Other chronic pain: Secondary | ICD-10-CM

## 2024-07-14 NOTE — Patient Instructions (Signed)
 [facility] with Dr. Yalcinkaya

## 2024-07-14 NOTE — Progress Notes (Signed)
 "  NEW GYNECOLOGY PATIENT Patient name: Margaret Levine MRN [id number]  Date of birth: 1996-05-24 Chief Complaint:   No chief complaint on file.     History:  Margaret Levine here for discussion of endometriosis and fertility concerns. Diagnosed 2019 with endometriosis and has had cyclic and non-cycle pain. Also has heavy menses, uses pads and tampons; typically just pads as tampon will push it out. Tried ibuprofen , toradol , tramadol, . Pain has improved from previous. Does not want to use Levine and is trying to conceive for 1.5 year- 2 years, has had 2 SABs and both her and her partner have kids. Periods normally 6-7 days. Has had 3 surgeries including an ectopic pregnancy with rupture of tube and removal of tube. Had 2 pregnancies in a year and lost both. Did not need assistance with getting pregnant.   Not currentlytakign anythign for it - will take ketroalac, ibuprofen  or tylenol , mostly to knck the edge off a litle bit. Now mostly with periods that seh is having pain. Pain is sharp, jabbing, pulsating and sciatic pain with it. Typically just being able to lay down helps some. PFPT has been transferred from [facility] to here and has appointment at the end of the month. Main concern is family expansion. When having the ectopic, feels that if she had been listened to and may not have needed the surgery/removal. Was told that pain tolerance higher than expected since had rupture at home. That morning had significant pain - had a viable pregnancy in the uterus so not thinking it was a heterotopic pregnancy and only at home about 2 hours before returning to the ED. Has had a prior child/pegnancy together  Margaret Levine    Per chart review:  lsc fulgeration, trial of orilissa which helped a little Tried robaxin, gabpentin,   In a Pain Clinic:  No, has seen a pain management specialist Prior Relevant Medications: Orlissa 150 mg daily - this  initially helped with symptoms but then stopped working  Orlissa 200 mg BID - significant side effects Aygestin OCPs - emotional Oxycodone  - did not really help Toradol  - not effective  Ibuprofen   Acetaminophen   Levothyroxine ( this was discontinued as thyroid  normalized ) Valium PRN        Gynecologic History Patient's last menstrual period was 07/01/2024. Levine: none   OB History  Gravida Para Term Preterm AB Living  3 1 1  2 1   SAB IAB Ectopic Multiple Live Births  1  1  1     # Outcome Date GA Lbr Len/2nd Weight Sex Type Anes PTL Lv  3 Ectopic 06/07/23 [redacted]w[redacted]d   U ECTOPIC     2 Term 10/04/17 [redacted]w[redacted]d   M CS-LVertical   LIV  1 SAB              The following portions of the patient's history were reviewed and updated as appropriate: allergies, current medications, past family history, past medical history, past social history, past surgical history and problem list. Health Maintenance  Topic Date Due   HIV Screening  Never done   Hepatitis C Screening  Never done   DTaP/Tdap/Td vaccine (1 - Tdap) Never done   Pneumococcal Vaccine (1 of 2 - PCV) Never done   Hepatitis B Vaccine (1 of 3 - 19+ 3-dose series) Never done   Pap Smear  Never done   Flu Shot  02/11/2024   COVID-19 Vaccine (1 - 2025-26 season) Never  done   HPV Vaccine (1 - 3-dose SCDM series) 06/02/2024   Meningitis B Vaccine  Aged Out     Review of Systems Pertinent items noted in HPI and remainder of comprehensive ROS otherwise negative.  Physical Exam:  BP 121/76   Pulse 68   Wt 143 lb 3.2 oz (65 kg)   LMP 07/01/2024   BMI 24.58 kg/m  Physical Exam Vitals and nursing note reviewed.  Constitutional:      Appearance: Normal appearance.  Pulmonary:     Effort: Pulmonary effort is normal.  Neurological:     General: No focal deficit present.     Mental Status: She is alert and oriented to person, place, and time.  Psychiatric:        Mood and Affect: Mood normal.        Behavior: Behavior  normal.        Thought Content: Thought content normal.        Judgment: Judgment normal.       Assessment and Plan:  1. Endometriosis (Primary) 2. Chronic pelvic pain in female 3. Infertility counseling Not currently on suppression as TTC. Agreed with prior recommendation for REI/advanced intervention given hx of pregnancy losses, endometriosis and prolonged unsuccessful attempt at conception. Reviewed that with IVF there is also opportunity for PEGT which may provide insight into SABs. When completed attempt or PP can return to suppression to manage symptoms. Continue PFPT. Given information for REI. All questions answered.   Follow-up: No follow-ups on file.      Carter Quarry, MD Obstetrician & Gynecologist, [facility] [employer], [facility] "

## 2025-04-23 ENCOUNTER — Ambulatory Visit: Admitting: Allergy and Immunology
